# Patient Record
Sex: Male | Born: 1955
Health system: Southern US, Community
[De-identification: ages and names within clinical notes are randomized; demographics above are authoritative.]

## PROBLEM LIST (undated history)

## (undated) DIAGNOSIS — D099 Carcinoma in situ, unspecified: Secondary | ICD-10-CM

## (undated) DIAGNOSIS — D229 Melanocytic nevi, unspecified: Secondary | ICD-10-CM

## (undated) DIAGNOSIS — E785 Hyperlipidemia, unspecified: Secondary | ICD-10-CM

## (undated) DIAGNOSIS — C6290 Malignant neoplasm of unspecified testis, unspecified whether descended or undescended: Secondary | ICD-10-CM

## (undated) HISTORY — DX: Hyperlipidemia, unspecified: E78.5

## (undated) HISTORY — DX: Melanocytic nevi, unspecified: D22.9

## (undated) HISTORY — DX: Malignant neoplasm of unspecified testis, unspecified whether descended or undescended: C62.90

## (undated) HISTORY — PX: KNEE SURGERY: SHX244

## (undated) HISTORY — PX: POLYPECTOMY: SHX149

## (undated) HISTORY — PX: ROTATOR CUFF REPAIR: SHX139

## (undated) HISTORY — PX: COLONOSCOPY: SHX174

## (undated) HISTORY — DX: Carcinoma in situ, unspecified: D09.9

---

## 2000-02-01 ENCOUNTER — Emergency Department (HOSPITAL_COMMUNITY): Admission: EM | Admit: 2000-02-01 | Discharge: 2000-02-01 | Payer: Self-pay | Admitting: Emergency Medicine

## 2000-03-24 ENCOUNTER — Emergency Department (HOSPITAL_COMMUNITY): Admission: EM | Admit: 2000-03-24 | Discharge: 2000-03-24 | Payer: Self-pay | Admitting: Emergency Medicine

## 2000-05-03 ENCOUNTER — Encounter: Payer: Self-pay | Admitting: Internal Medicine

## 2004-07-04 HISTORY — PX: OTHER SURGICAL HISTORY: SHX169

## 2004-07-19 ENCOUNTER — Encounter: Admission: RE | Admit: 2004-07-19 | Discharge: 2004-07-19 | Payer: Self-pay | Admitting: Internal Medicine

## 2004-07-19 ENCOUNTER — Ambulatory Visit: Payer: Self-pay | Admitting: Internal Medicine

## 2004-07-20 ENCOUNTER — Ambulatory Visit: Payer: Self-pay | Admitting: Internal Medicine

## 2004-07-20 ENCOUNTER — Observation Stay (HOSPITAL_COMMUNITY): Admission: EM | Admit: 2004-07-20 | Discharge: 2004-07-21 | Payer: Self-pay | Admitting: Neurosurgery

## 2004-07-21 ENCOUNTER — Encounter (INDEPENDENT_AMBULATORY_CARE_PROVIDER_SITE_OTHER): Payer: Self-pay | Admitting: Specialist

## 2004-07-23 ENCOUNTER — Ambulatory Visit: Payer: Self-pay | Admitting: Internal Medicine

## 2004-07-23 ENCOUNTER — Encounter: Admission: RE | Admit: 2004-07-23 | Discharge: 2004-07-23 | Payer: Self-pay | Admitting: Internal Medicine

## 2004-07-26 ENCOUNTER — Ambulatory Visit: Payer: Self-pay | Admitting: Oncology

## 2004-07-27 ENCOUNTER — Ambulatory Visit: Admission: RE | Admit: 2004-07-27 | Discharge: 2004-07-27 | Payer: Self-pay | Admitting: Urology

## 2004-07-27 ENCOUNTER — Ambulatory Visit (HOSPITAL_BASED_OUTPATIENT_CLINIC_OR_DEPARTMENT_OTHER): Admission: RE | Admit: 2004-07-27 | Discharge: 2004-07-27 | Payer: Self-pay | Admitting: Urology

## 2004-07-27 ENCOUNTER — Encounter (INDEPENDENT_AMBULATORY_CARE_PROVIDER_SITE_OTHER): Payer: Self-pay | Admitting: *Deleted

## 2004-08-03 ENCOUNTER — Ambulatory Visit (HOSPITAL_COMMUNITY): Admission: RE | Admit: 2004-08-03 | Discharge: 2004-08-03 | Payer: Self-pay | Admitting: Oncology

## 2004-08-03 ENCOUNTER — Ambulatory Visit: Admission: RE | Admit: 2004-08-03 | Discharge: 2004-09-14 | Payer: Self-pay | Admitting: Radiation Oncology

## 2004-09-22 ENCOUNTER — Ambulatory Visit: Payer: Self-pay | Admitting: Oncology

## 2004-10-01 ENCOUNTER — Encounter: Payer: Self-pay | Admitting: Internal Medicine

## 2004-10-21 ENCOUNTER — Ambulatory Visit: Admission: RE | Admit: 2004-10-21 | Discharge: 2004-10-21 | Payer: Self-pay | Admitting: Radiation Oncology

## 2004-10-27 ENCOUNTER — Ambulatory Visit (HOSPITAL_COMMUNITY): Admission: RE | Admit: 2004-10-27 | Discharge: 2004-10-27 | Payer: Self-pay | Admitting: Oncology

## 2005-01-20 ENCOUNTER — Ambulatory Visit: Payer: Self-pay | Admitting: Oncology

## 2005-04-12 ENCOUNTER — Ambulatory Visit (HOSPITAL_COMMUNITY): Admission: RE | Admit: 2005-04-12 | Discharge: 2005-04-12 | Payer: Self-pay | Admitting: Oncology

## 2005-05-12 ENCOUNTER — Ambulatory Visit: Payer: Self-pay | Admitting: Oncology

## 2005-11-11 ENCOUNTER — Ambulatory Visit: Payer: Self-pay | Admitting: Oncology

## 2005-11-11 LAB — CBC WITH DIFFERENTIAL/PLATELET
BASO%: 0 % (ref 0.0–2.0)
EOS%: 1.4 % (ref 0.0–7.0)
HCT: 40.2 % (ref 38.7–49.9)
LYMPH%: 17.4 % (ref 14.0–48.0)
MCH: 31.9 pg (ref 28.0–33.4)
MCHC: 35 g/dL (ref 32.0–35.9)
MCV: 91.3 fL (ref 81.6–98.0)
MONO%: 9.7 % (ref 0.0–13.0)
NEUT%: 71.5 % (ref 40.0–75.0)
Platelets: 246 10*3/uL (ref 145–400)

## 2005-11-11 LAB — COMPREHENSIVE METABOLIC PANEL
ALT: 17 U/L (ref 0–40)
CO2: 24 mEq/L (ref 19–32)
Calcium: 9.3 mg/dL (ref 8.4–10.5)
Chloride: 106 mEq/L (ref 96–112)
Creatinine, Ser: 1 mg/dL (ref 0.4–1.5)
Sodium: 138 mEq/L (ref 135–145)
Total Protein: 7 g/dL (ref 6.0–8.3)

## 2005-11-11 LAB — LACTATE DEHYDROGENASE: LDH: 155 U/L (ref 94–250)

## 2005-11-12 IMAGING — PT NM PET TUM IMG SKULL BASE T - THIGH
1 of 4 series · 1 of 25 positions shown · non-contrast
Comparison: This study is correlated with recent CT scan and ultrasound.

CLINICAL DATA: Testicular cancer.  

 FDG PET-CT TUMOR IMAGING (SKULL BASE TO THIGHS)
 Patient Weight:  170 pounds.  
 Fasting Blood Glucose:  87
TECHNIQUE: 15.4 mCi F-18 FDG were administered via right antecubital.  Full ring PET imaging was performed from the skull base through the mid-thighs 70 minutes after injection.  CT data was obtained and used for attenuation correction and anatomic localization only.  (This was not acquired as a diagnostic CT examination.)

[Series 2: ct images · axial · 3.8mm · 0.98mm/px · 1 of 267 slices shown]
[im 267/267  brain]
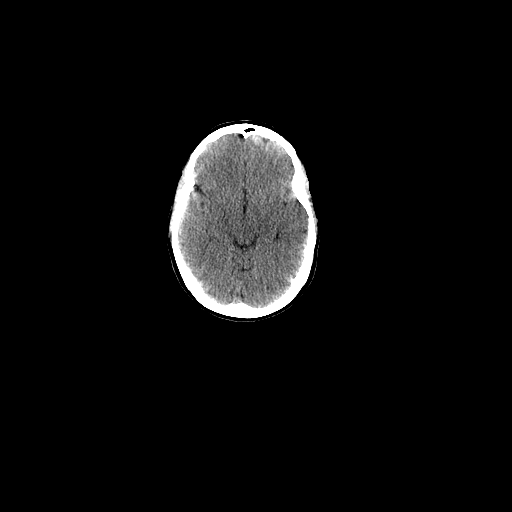

[1 of 25 positions shown; findings below may reference images not displayed]

FINDINGS: The patient has had a left orchiectomy and a biopsy of a left retroperitoneal nodal mass.
 There is abnormal FDG accumulation in the left retroperitoneal mass, which is just below the level of the left renal vein.  This has an SUV of 5.3.  There is mild obstructive hydronephrosis of the left kidney.  I do not see any other definite areas of abnormal FDG accumulation in the pelvis.  There is FDG activity in the surgical bed in the left groin area.  
 No areas of abnormal FDG accumulation are seen in the neck or chest.
IMPRESSION: 1.  Abnormal FDG accumulation in the bulky left retroperitoneal nodal mass located just below the left renal vein.  There is obstructive hydronephrosis of the left kidney.  
 2.  No other areas of abnormal FDG accumulation are seen to suggest other areas of metastatic disease.  
 3.  FDG accumulation in the region of the surgical incision in the left groin area.

## 2005-11-15 ENCOUNTER — Ambulatory Visit (HOSPITAL_COMMUNITY): Admission: RE | Admit: 2005-11-15 | Discharge: 2005-11-15 | Payer: Self-pay | Admitting: Oncology

## 2006-05-09 ENCOUNTER — Ambulatory Visit (HOSPITAL_COMMUNITY): Admission: RE | Admit: 2006-05-09 | Discharge: 2006-05-09 | Payer: Self-pay | Admitting: Oncology

## 2006-05-10 ENCOUNTER — Ambulatory Visit: Payer: Self-pay | Admitting: Oncology

## 2006-05-12 LAB — COMPREHENSIVE METABOLIC PANEL
ALT: 15 U/L (ref 0–53)
AST: 17 U/L (ref 0–37)
Alkaline Phosphatase: 77 U/L (ref 39–117)
CO2: 26 mEq/L (ref 19–32)
Sodium: 140 mEq/L (ref 135–145)
Total Bilirubin: 0.4 mg/dL (ref 0.3–1.2)
Total Protein: 7.4 g/dL (ref 6.0–8.3)

## 2006-05-12 LAB — CBC WITH DIFFERENTIAL/PLATELET
BASO%: 0.3 % (ref 0.0–2.0)
EOS%: 2.4 % (ref 0.0–7.0)
LYMPH%: 21.1 % (ref 14.0–48.0)
MCHC: 35 g/dL (ref 32.0–35.9)
MONO#: 0.5 10*3/uL (ref 0.1–0.9)
Platelets: 240 10*3/uL (ref 145–400)
RBC: 4.63 10*6/uL (ref 4.20–5.71)
WBC: 5.6 10*3/uL (ref 4.0–10.0)
lymph#: 1.2 10*3/uL (ref 0.9–3.3)

## 2006-05-12 LAB — LACTATE DEHYDROGENASE: LDH: 154 U/L (ref 94–250)

## 2006-12-19 ENCOUNTER — Ambulatory Visit: Payer: Self-pay | Admitting: Internal Medicine

## 2006-12-19 LAB — CONVERTED CEMR LAB
AST: 24 units/L (ref 0–37)
Alkaline Phosphatase: 67 units/L (ref 39–117)
BUN: 17 mg/dL (ref 6–23)
Basophils Relative: 0.2 % (ref 0.0–1.0)
Creatinine, Ser: 1 mg/dL (ref 0.4–1.5)
GFR calc non Af Amer: 84 mL/min
Glucose, Bld: 82 mg/dL (ref 70–99)
HDL: 43.8 mg/dL (ref >39.0)
Hemoglobin: 13.5 g/dL (ref 13.0–17.0)
MCV: 91.1 fL (ref 78.0–100.0)
Monocytes Absolute: 0.4 10*3/uL (ref 0.2–0.7)
Neutrophils Relative %: 69.4 % (ref 43.0–77.0)
PSA: 0.42 ng/mL (ref 0.10–4.00)
Platelets: 202 10*3/uL (ref 150–400)
RBC: 4.23 M/uL (ref 4.22–5.81)
RDW: 13 % (ref 11.5–14.6)
TSH: 1 microintl units/mL (ref 0.35–5.50)
Triglycerides: 68 mg/dL (ref 0–149)
VLDL: 14 mg/dL (ref 0–40)

## 2006-12-26 ENCOUNTER — Ambulatory Visit: Payer: Self-pay | Admitting: Internal Medicine

## 2007-01-26 ENCOUNTER — Ambulatory Visit: Payer: Self-pay | Admitting: Gastroenterology

## 2007-02-13 ENCOUNTER — Ambulatory Visit: Payer: Self-pay | Admitting: Gastroenterology

## 2007-02-13 ENCOUNTER — Encounter: Payer: Self-pay | Admitting: Gastroenterology

## 2007-02-13 LAB — HM COLONOSCOPY: HM Colonoscopy: NORMAL

## 2007-05-04 ENCOUNTER — Ambulatory Visit: Payer: Self-pay | Admitting: Oncology

## 2007-05-08 ENCOUNTER — Encounter: Payer: Self-pay | Admitting: Internal Medicine

## 2007-05-08 ENCOUNTER — Ambulatory Visit (HOSPITAL_COMMUNITY): Admission: RE | Admit: 2007-05-08 | Discharge: 2007-05-08 | Payer: Self-pay | Admitting: Oncology

## 2007-05-08 LAB — COMPREHENSIVE METABOLIC PANEL
ALT: 14 U/L (ref 0–53)
AST: 17 U/L (ref 0–37)
Albumin: 4.2 g/dL (ref 3.5–5.2)
Calcium: 9.3 mg/dL (ref 8.4–10.5)
Chloride: 102 mEq/L (ref 96–112)
Potassium: 4.4 mEq/L (ref 3.5–5.3)

## 2007-05-08 LAB — CBC WITH DIFFERENTIAL/PLATELET
BASO%: 0.2 % (ref 0.0–2.0)
EOS%: 1.8 % (ref 0.0–7.0)
MCH: 32.6 pg (ref 28.0–33.4)
MCHC: 36.5 g/dL — ABNORMAL HIGH (ref 32.0–35.9)
RBC: 4.32 10*6/uL (ref 4.20–5.71)
RDW: 13.4 % (ref 11.2–14.6)
lymph#: 1 10*3/uL (ref 0.9–3.3)

## 2007-05-15 ENCOUNTER — Encounter: Payer: Self-pay | Admitting: Internal Medicine

## 2007-07-24 ENCOUNTER — Ambulatory Visit: Payer: Self-pay | Admitting: Internal Medicine

## 2007-07-24 LAB — CONVERTED CEMR LAB
HDL: 39.2 mg/dL (ref >39.0)
LDL Cholesterol: 146 mg/dL — ABNORMAL HIGH (ref 0–99)
Total CHOL/HDL Ratio: 5.1

## 2007-07-31 ENCOUNTER — Ambulatory Visit: Payer: Self-pay | Admitting: Internal Medicine

## 2007-07-31 DIAGNOSIS — C6290 Malignant neoplasm of unspecified testis, unspecified whether descended or undescended: Secondary | ICD-10-CM | POA: Insufficient documentation

## 2007-07-31 DIAGNOSIS — E785 Hyperlipidemia, unspecified: Secondary | ICD-10-CM | POA: Insufficient documentation

## 2007-07-31 DIAGNOSIS — R635 Abnormal weight gain: Secondary | ICD-10-CM | POA: Insufficient documentation

## 2007-07-31 LAB — CONVERTED CEMR LAB
Cholesterol, target level: 200 mg/dL
LDL Goal: 130 mg/dL

## 2007-08-03 ENCOUNTER — Telehealth: Payer: Self-pay | Admitting: Internal Medicine

## 2007-10-16 ENCOUNTER — Ambulatory Visit: Payer: Self-pay | Admitting: Internal Medicine

## 2007-10-16 DIAGNOSIS — T887XXA Unspecified adverse effect of drug or medicament, initial encounter: Secondary | ICD-10-CM | POA: Insufficient documentation

## 2007-10-16 DIAGNOSIS — E039 Hypothyroidism, unspecified: Secondary | ICD-10-CM | POA: Insufficient documentation

## 2007-10-16 LAB — CONVERTED CEMR LAB
ALT: 21 units/L (ref 0–53)
Albumin: 4 g/dL (ref 3.5–5.2)
Alkaline Phosphatase: 70 units/L (ref 39–117)
Bilirubin, Direct: 0.2 mg/dL (ref 0.0–0.3)
Cholesterol: 191 mg/dL (ref 0–200)
HDL: 39.8 mg/dL (ref >39.0)
LDL Cholesterol: 140 mg/dL — ABNORMAL HIGH (ref 0–99)
Testosterone: 471.78 ng/dL (ref 350.00–890)
Total Bilirubin: 0.8 mg/dL (ref 0.3–1.2)
Total Protein: 6.8 g/dL (ref 6.0–8.3)
Triglycerides: 55 mg/dL (ref 0–149)
VLDL: 11 mg/dL (ref 0–40)

## 2007-10-23 ENCOUNTER — Ambulatory Visit: Payer: Self-pay | Admitting: Internal Medicine

## 2007-10-23 DIAGNOSIS — B070 Plantar wart: Secondary | ICD-10-CM | POA: Insufficient documentation

## 2007-10-23 DIAGNOSIS — E291 Testicular hypofunction: Secondary | ICD-10-CM | POA: Insufficient documentation

## 2008-01-15 ENCOUNTER — Ambulatory Visit: Payer: Self-pay | Admitting: Internal Medicine

## 2008-01-15 LAB — CONVERTED CEMR LAB: Total Protein: 6.6 g/dL (ref 6.0–8.3)

## 2008-01-22 ENCOUNTER — Ambulatory Visit: Payer: Self-pay | Admitting: Internal Medicine

## 2008-05-01 ENCOUNTER — Ambulatory Visit: Payer: Self-pay | Admitting: Oncology

## 2008-05-06 ENCOUNTER — Encounter: Payer: Self-pay | Admitting: Internal Medicine

## 2008-05-06 ENCOUNTER — Ambulatory Visit (HOSPITAL_COMMUNITY): Admission: RE | Admit: 2008-05-06 | Discharge: 2008-05-06 | Payer: Self-pay | Admitting: Oncology

## 2008-05-06 LAB — CBC WITH DIFFERENTIAL/PLATELET
BASO%: 0.3 % (ref 0.0–2.0)
EOS%: 1.7 % (ref 0.0–7.0)
HGB: 14.8 g/dL (ref 13.0–17.1)
MONO%: 8.8 % (ref 0.0–13.0)
NEUT#: 3.5 10*3/uL (ref 1.5–6.5)
NEUT%: 66.3 % (ref 40.0–75.0)
WBC: 5.3 10*3/uL (ref 4.0–10.0)

## 2008-05-06 LAB — COMPREHENSIVE METABOLIC PANEL
Albumin: 4.4 g/dL (ref 3.5–5.2)
CO2: 28 mEq/L (ref 19–32)
Glucose, Bld: 86 mg/dL (ref 70–99)
Potassium: 4.6 mEq/L (ref 3.5–5.3)
Sodium: 140 mEq/L (ref 135–145)
Total Protein: 7 g/dL (ref 6.0–8.3)

## 2008-05-06 LAB — LACTATE DEHYDROGENASE: LDH: 134 U/L (ref 94–250)

## 2008-05-13 ENCOUNTER — Encounter: Payer: Self-pay | Admitting: Internal Medicine

## 2008-06-18 ENCOUNTER — Telehealth: Payer: Self-pay | Admitting: Internal Medicine

## 2008-06-30 ENCOUNTER — Telehealth: Payer: Self-pay | Admitting: Internal Medicine

## 2008-09-17 ENCOUNTER — Ambulatory Visit: Payer: Self-pay | Admitting: Internal Medicine

## 2008-09-17 LAB — CONVERTED CEMR LAB
Albumin: 3.8 g/dL (ref 3.5–5.2)
Alkaline Phosphatase: 73 units/L (ref 39–117)
Basophils Relative: 0.1 % (ref 0.0–3.0)
Bilirubin, Direct: 0 mg/dL (ref 0.0–0.3)
Calcium: 9.2 mg/dL (ref 8.4–10.5)
Chloride: 107 meq/L (ref 96–112)
Cholesterol: 205 mg/dL — ABNORMAL HIGH (ref 0–200)
Creatinine, Ser: 0.9 mg/dL (ref 0.4–1.5)
Eosinophils Absolute: 0.1 10*3/uL (ref 0.0–0.7)
Eosinophils Relative: 1.6 % (ref 0.0–5.0)
GFR calc non Af Amer: 93.81 mL/min (ref >60)
Glucose, Bld: 89 mg/dL (ref 70–99)
Hemoglobin: 14.5 g/dL (ref 13.0–17.0)
Lymphocytes Relative: 23 % (ref 12.0–46.0)
MCHC: 34.7 g/dL (ref 30.0–36.0)
Monocytes Absolute: 0.3 10*3/uL (ref 0.1–1.0)
Monocytes Relative: 6.9 % (ref 3.0–12.0)
Neutrophils Relative %: 68.4 % (ref 43.0–77.0)
RBC: 4.52 M/uL (ref 4.22–5.81)
RDW: 12.7 % (ref 11.5–14.6)
Sodium: 142 meq/L (ref 135–145)
Total CHOL/HDL Ratio: 5
Total Protein: 7 g/dL (ref 6.0–8.3)
Triglycerides: 73 mg/dL (ref 0.0–149.0)
pH: 7.5

## 2008-09-24 ENCOUNTER — Ambulatory Visit: Payer: Self-pay | Admitting: Internal Medicine

## 2008-09-24 LAB — CONVERTED CEMR LAB: LDL Goal: 160 mg/dL

## 2008-10-01 ENCOUNTER — Telehealth: Payer: Self-pay | Admitting: Internal Medicine

## 2008-12-25 ENCOUNTER — Ambulatory Visit: Payer: Self-pay | Admitting: Internal Medicine

## 2008-12-25 LAB — CONVERTED CEMR LAB
ALT: 19 units/L (ref 0–53)
HDL: 45.8 mg/dL (ref >39.00)
Triglycerides: 58 mg/dL (ref 0.0–149.0)

## 2009-01-01 ENCOUNTER — Ambulatory Visit: Payer: Self-pay | Admitting: Internal Medicine

## 2009-05-05 ENCOUNTER — Ambulatory Visit: Payer: Self-pay | Admitting: Oncology

## 2009-05-07 ENCOUNTER — Encounter (INDEPENDENT_AMBULATORY_CARE_PROVIDER_SITE_OTHER): Payer: Self-pay | Admitting: *Deleted

## 2009-05-07 LAB — COMPREHENSIVE METABOLIC PANEL
AST: 18 U/L (ref 0–37)
Albumin: 4.5 g/dL (ref 3.5–5.2)
CO2: 22 mEq/L (ref 19–32)
Chloride: 105 mEq/L (ref 96–112)
Creatinine, Ser: 1.03 mg/dL (ref 0.40–1.50)
Glucose, Bld: 90 mg/dL (ref 70–99)
Sodium: 139 mEq/L (ref 135–145)
Total Protein: 7.4 g/dL (ref 6.0–8.3)

## 2009-05-07 LAB — CBC WITH DIFFERENTIAL/PLATELET
BASO%: 0.4 % (ref 0.0–2.0)
Basophils Absolute: 0 10*3/uL (ref 0.0–0.1)
EOS%: 2.5 % (ref 0.0–7.0)
Eosinophils Absolute: 0.1 10*3/uL (ref 0.0–0.5)
MCHC: 34.8 g/dL (ref 32.0–36.0)
MONO#: 0.5 10*3/uL (ref 0.1–0.9)
MONO%: 9.1 % (ref 0.0–14.0)
NEUT#: 3.2 10*3/uL (ref 1.5–6.5)
NEUT%: 62.8 % (ref 39.0–75.0)
RBC: 4.65 10*6/uL (ref 4.20–5.82)
lymph#: 1.3 10*3/uL (ref 0.9–3.3)

## 2009-05-12 ENCOUNTER — Encounter (INDEPENDENT_AMBULATORY_CARE_PROVIDER_SITE_OTHER): Payer: Self-pay | Admitting: *Deleted

## 2009-09-24 ENCOUNTER — Ambulatory Visit: Payer: Self-pay | Admitting: Internal Medicine

## 2009-09-24 LAB — CONVERTED CEMR LAB
AST: 24 units/L (ref 0–37)
Alkaline Phosphatase: 65 units/L (ref 39–117)
BUN: 16 mg/dL (ref 6–23)
Blood in Urine, dipstick: NEGATIVE
Calcium: 9.1 mg/dL (ref 8.4–10.5)
Chloride: 106 meq/L (ref 96–112)
Cholesterol: 209 mg/dL — ABNORMAL HIGH (ref 0–200)
Creatinine, Ser: 0.9 mg/dL (ref 0.4–1.5)
Glucose, Urine, Semiquant: NEGATIVE
HDL: 49.4 mg/dL (ref >39.00)
Hemoglobin: 14 g/dL (ref 13.0–17.0)
Lymphocytes Relative: 26.7 % (ref 12.0–46.0)
Lymphs Abs: 1.1 10*3/uL (ref 0.7–4.0)
MCV: 93.7 fL (ref 78.0–100.0)
Monocytes Absolute: 0.4 10*3/uL (ref 0.1–1.0)
Monocytes Relative: 10.1 % (ref 3.0–12.0)
Neutro Abs: 2.7 10*3/uL (ref 1.4–7.7)
PSA: 0.59 ng/mL (ref 0.10–4.00)
Potassium: 4 meq/L (ref 3.5–5.1)
Protein, U semiquant: NEGATIVE
RBC: 4.4 M/uL (ref 4.22–5.81)
RDW: 13.3 % (ref 11.5–14.6)
Specific Gravity, Urine: 1.025
Testosterone: 603.6 ng/dL (ref 350.00–890.00)
Total Bilirubin: 0.5 mg/dL (ref 0.3–1.2)
Total CHOL/HDL Ratio: 4
Total Protein: 7 g/dL (ref 6.0–8.3)
Triglycerides: 78 mg/dL (ref 0.0–149.0)
Urobilinogen, UA: 0.2
WBC Urine, dipstick: NEGATIVE
WBC: 4.3 10*3/uL — ABNORMAL LOW (ref 4.5–10.5)

## 2009-10-12 ENCOUNTER — Ambulatory Visit: Payer: Self-pay | Admitting: Internal Medicine

## 2009-12-16 ENCOUNTER — Telehealth: Payer: Self-pay | Admitting: Internal Medicine

## 2010-01-13 ENCOUNTER — Telehealth: Payer: Self-pay | Admitting: *Deleted

## 2010-04-08 ENCOUNTER — Ambulatory Visit: Payer: Self-pay | Admitting: Internal Medicine

## 2010-04-08 LAB — CONVERTED CEMR LAB
Bilirubin, Direct: 0.1 mg/dL (ref 0.0–0.3)
Total Protein: 6.5 g/dL (ref 6.0–8.3)

## 2010-04-15 ENCOUNTER — Ambulatory Visit: Payer: Self-pay | Admitting: Internal Medicine

## 2010-04-15 DIAGNOSIS — M25429 Effusion, unspecified elbow: Secondary | ICD-10-CM | POA: Insufficient documentation

## 2010-07-24 ENCOUNTER — Encounter: Payer: Self-pay | Admitting: Internal Medicine

## 2010-07-25 ENCOUNTER — Encounter: Payer: Self-pay | Admitting: Oncology

## 2010-08-03 NOTE — Assessment & Plan Note (Signed)
Summary: cpx/njr/PT RESCD FROM BUMP//CCM   Vital Signs:  Patient profile:   55 year old male Height:      69 inches Weight:      176 pounds BMI:     26.08 Temp:     98.2 degrees F oral Pulse rate:   64 / minute Pulse rhythm:   regular Resp:     14 per minute BP sitting:   116 / 78  (left arm)  Vitals Entered By: Willy Eddy, LPN (October 12, 2009 8:28 AM)  Nutrition Counseling: Patient's BMI is greater than 25 and therefore counseled on weight management options.  CC: cpx, Lipid Management   CC:  cpx and Lipid Management.  History of Present Illness: The pt was asked about all immunizations, health maint. services that are appropriate to their age and was given guidance on diet exercize  and weight management   lipid mangement  Hyperlipidemia Follow-Up      This is a 55 year old man who presents for Hyperlipidemia follow-up.  The patient denies muscle aches, GI upset, abdominal pain, flushing, itching, constipation, diarrhea, and fatigue.  The patient denies the following symptoms: chest pain/pressure, exercise intolerance, dypsnea, palpitations, syncope, and pedal edema.  Compliance with medications (by patient report) has been near 100%.  Dietary compliance has been good.  The patient reports exercising 3-4X per week.  Adjunctive measures currently used by the patient include niacin and weight reduction.    Hyperlipidemia Follow-Up      had side effects for  statins.  The patient denies muscle aches, GI upset, abdominal pain, flushing, itching, constipation, diarrhea, and fatigue.  The patient denies the following symptoms: chest pain/pressure, exercise intolerance, dypsnea, palpitations, syncope, and pedal edema.    Lipid Management History:      Positive NCEP/ATP III risk factors include male age 55 years old or older.  Negative NCEP/ATP III risk factors include no family history for ischemic heart disease and non-tobacco-user status.        The patient states that he  knows about the "Therapeutic Lifestyle Change" diet.  The patient expresses understanding of adjunctive measures for cholesterol lowering.  Adjunctive measures started by the patient include aerobic exercise and weight reduction.     Preventive Screening-Counseling & Management  Alcohol-Tobacco     Smoking Status: never     Passive Smoke Exposure: no  Problems Prior to Update: 1)  Uns Advrs Eff Uns Rx Medicinal&biological Sbstnc  (ICD-995.20) 2)  Preventive Health Care  (ICD-V70.0) 3)  Plantar Wart  (ICD-078.12) 4)  Hypogonadism, Male  (ICD-257.2) 5)  Hypothyroidism  (ICD-244.9) 6)  Uns Advrs Eff Uns Rx Medicinal&biological Sbstnc  (ICD-995.20) 7)  Abnormal Weight Gain  (ICD-783.1) 8)  Testicular Cancer  (ICD-186.9) 9)  Hyperlipidemia  (ICD-272.4)  Current Problems (verified): 1)  Uns Advrs Eff Uns Rx Medicinal&biological Sbstnc  (ICD-995.20) 2)  Preventive Health Care  (ICD-V70.0) 3)  Plantar Wart  (ICD-078.12) 4)  Hypogonadism, Male  (ICD-257.2) 5)  Hypothyroidism  (ICD-244.9) 6)  Uns Advrs Eff Uns Rx Medicinal&biological Sbstnc  (ICD-995.20) 7)  Abnormal Weight Gain  (ICD-783.1) 8)  Testicular Cancer  (ICD-186.9) 9)  Hyperlipidemia  (ICD-272.4)  Medications Prior to Update: 1)  Androgel Pump 1 %  Gel (Testosterone) .... 4 Pumps To Skin As Directed Daily 2)  Antioxidant   Tabs (Multiple Vitamins-Minerals) .... At Times 3)  Adult Aspirin Low Strength 81 Mg  Tbdp (Aspirin) .Marland Kitchen.. 1 Once Daily 4)  Fish Oil 1000 Mg Caps (Omega-3  Fatty Acids) .... Two Tabs Bid  Current Medications (verified): 1)  Co Q-10 Plus Red Yeast Rice 60-600 Mg Caps (Coenzyme Q10-Red Yeast Rice) .... Combination  ( Beyond Red Rice Yeast) 2)  Androgel Pump 1 %  Gel (Testosterone) .... 4 Pumps To Skin As Directed Daily 3)  Antioxidant   Tabs (Multiple Vitamins-Minerals) .... At Times 4)  Adult Aspirin Low Strength 81 Mg  Tbdp (Aspirin) .Marland Kitchen.. 1 Once Daily 5)  Fish Oil 1000 Mg Caps (Omega-3 Fatty Acids) .... Two  Tabs Bid 6)  Red Yeast Rice 600 Mg Tabs (Red Yeast Rice Extract) .Marland Kitchen.. 1 Once Daily  Allergies (verified): 1)  ! * Bee Sting  Past History:  Family History: Last updated: August 23, 2007 Father died 12/06/00 epilepsy mother alive and well no family hx of cancer  Social History: Last updated: Aug 23, 2007 Occupation:construction Married Never Smoked Alcohol use-no Drug use-no Regular exercise-yes  Risk Factors: Exercise: yes (08/23/2007)  Risk Factors: Smoking Status: never (10/12/2009) Passive Smoke Exposure: no (10/12/2009)  Past medical, surgical, family and social histories (including risk factors) reviewed, and no changes noted (except as noted below).  Past Medical History: Reviewed history from August 23, 2007 and no changes required. Hyperlipidmia testicular cancer Left testicular orchiectomy 12-06-2004 Hyperlipidemia  Past Surgical History: Reviewed history from 2007-08-23 and no changes required. Orchiectomy L  Family History: Reviewed history from 08/23/07 and no changes required. Father died Dec 06, 2000 epilepsy mother alive and well no family hx of cancer  Social History: Reviewed history from 08-23-07 and no changes required. 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    Physical Exam  General:  Well-developed,well-nourished,in no acute distress; alert,appropriate and cooperative throughout  examination Head:  Normocephalic and atraumatic without obvious abnormalities. No apparent alopecia or balding. Eyes:  pupils equal and pupils round.   Ears:  R ear normal and L ear normal.   Nose:  no external deformity and no nasal discharge.   Mouth:  good dentition and pharynx pink and moist.   Neck:  No deformities, masses, or tenderness noted. Lungs:  Normal respiratory effort, chest expands symmetrically. Lungs are clear to auscultation, no crackles or wheezes. Heart:  Normal rate and regular rhythm. S1 and S2 normal without gallop, murmur, click, rub or other extra sounds. Abdomen:  Bowel sounds positive,abdomen soft and non-tender without masses, organomegaly or hernias noted. Msk:  No deformity or scoliosis noted of thoracic or lumbar spine.   Pulses:  R and L carotid,radial,femoral,dorsalis pedis and posterior tibial pulses are full and equal bilaterally Extremities:  No clubbing, cyanosis, edema, or deformity noted with normal full range of motion of all joints.   Neurologic:  No cranial nerve deficits noted. Station and gait are normal. Plantar reflexes are down-going bilaterally. DTRs are symmetrical throughout. Sensory, motor and coordinative functions appear intact.   Impression & Recommendations:  Problem # 1:  PREVENTIVE HEALTH CARE (ICD-V70.0) The pt was asked about all immunizations, health maint. services that are appropriate to their age and was given guidance on diet exercize  and weight management  Colonoscopy: normal (01/26/2007) Td Booster: Tdap (07/04/2006)   Chol: 209 (09/24/2009)  HDL: 49.40 (09/24/2009)   LDL: 133 (12/25/2008)   TG: 78.0 (09/24/2009) TSH: 1.44 (09/24/2009)   PSA: 0.59 (09/24/2009) Next Colonoscopy due:: 02/2017 (09/24/2008)  Discussed using sunscreen, use of alcohol, drug use, self testicular exam, routine dental care, routine eye care, routine physical exam, seat belts, multiple vitamins, osteoporosis prevention, adequate calcium intake in  diet, and recommendations for immunizations.  Discussed exercise and checking cholesterol.  Discussed gun safety, safe sex, and contraception. Also recommend checking PSA.  Problem # 2:  HYPERLIPIDEMIA (ICD-272.4) had side effects from statin trial of red rice yeast   Labs Reviewed: SGOT: 24 (09/24/2009)   SGPT: 19 (09/24/2009)  Lipid Goals: Chol Goal: 200 (07/31/2007)   HDL Goal: 40 (07/31/2007)   LDL Goal: 160 (09/24/2008)   TG Goal: 150 (07/31/2007)  Prior 10 Yr Risk Heart Disease: 6 % (01/01/2009)   HDL:49.40 (09/24/2009), 45.80 (12/25/2008)  LDL:133 (12/25/2008), 117 (01/15/2008)  Chol:209 (09/24/2009), 190 (12/25/2008)  Trig:78.0 (09/24/2009), 58.0 (12/25/2008)  Complete Medication List: 1)  Co Q-10 Plus Red Yeast Rice 60-600 Mg Caps (Coenzyme q10-red yeast rice) .... Combination  ( beyond red rice yeast) 2)  Androgel Pump 1 % Gel (Testosterone) .... 4 pumps to skin as directed daily 3)  Antioxidant Tabs (Multiple vitamins-minerals) .... At times 4)  Adult Aspirin Low Strength 81 Mg Tbdp (Aspirin) .Marland Kitchen.. 1 once daily 5)  Fish Oil 1000 Mg Caps (Omega-3 fatty acids) .... Two tabs bid 6)  Red Yeast Rice 600 Mg Tabs (Red yeast rice extract) .Marland Kitchen.. 1 once daily  Lipid Assessment/Plan:      Based on NCEP/ATP III, the patient's risk factor category is "0-1 risk factors".  The patient's lipid goals are as follows: Total cholesterol goal is 200; LDL cholesterol goal is 160; HDL cholesterol goal is 40; Triglyceride goal is 150.  His LDL cholesterol goal has not been met.  Secondary causes for hyperlipidemia have been ruled out.  He has been counseled on adjunctive measures for lowering his cholesterol and has been provided with dietary instructions.    Patient Instructions: 1)  Must  faithful with the red rice yeast 2)  weight reduction 5 lbs 3)  Please schedule a follow-up appointment in 6 months. 4)  Hepatic Panel prior to visit, ICD-9:995.20 5)  Lipid Panel prior to visit,  ICD-9:272.4

## 2010-08-03 NOTE — Progress Notes (Signed)
Summary: REQ FOR RETURN CALL  Phone Note Call from Patient   Caller: Patient   [phone] Reason for Call: Talk to Nurse Complaint: Urinary/GYN Problems Summary of Call: Pt called to speak with Dr Bynum Bellows, LPN...Marland KitchenMarland KitchenPt req an alternate medication because [employer] will no longer cover the cost of this medication  ANDROGEL PUMP 1 %  GEL .... Pt req a return call so he can discuss alternate medication.... Pt can be reached at [phone].  Initial call taken by: Debbra Riding,  December 16, 2009 3:11 PM  Follow-up for Phone Call        [facility] and spoke with Viviann Spare (Representative).... He adv that the pts acct is showing that the medication is still covered.  Contacted patient and requested that he fax a copy of the letter he received to [facility] so we can call and follow up on it.  Follow-up by: Debbra Riding,  December 16, 2009 3:47 PM  Additional Follow-up for Phone Call Additional follow up Details #1::        Awaiting faxed letter from pt... Pt was advised that we would attempt to handle situation as it arises concerning insurance [facility]) refusal to cover his medication (Androgel) after July 1st, 2011.   Additional Follow-up by: Debbra Riding,  December 17, 2009 8:47 AM

## 2010-08-03 NOTE — Progress Notes (Signed)
----   Converted from flag ---- ---- 12/17/2009 1:51 PM, Willy Eddy, LPN wrote: call [facility] and call in Delvonte Kirkpatrick's androgel to get it pre approved ------------------------------  changed to testim per insurance --pt informed and med called to [facility]

## 2010-08-03 NOTE — Assessment & Plan Note (Signed)
Summary: 6 mo rov/mm/pt rescd from bump//ccm   Vital Signs:  Patient profile:   55 year old male Height:      69 inches Weight:      168 pounds BMI:     24.90 Temp:     98.2 degrees F oral Pulse rate:   68 / minute Resp:     14 per minute BP sitting:   120 / 76  (left arm)  Vitals Entered By: Willy Eddy, LPN (April 15, 2010 9:09 AM) CC: roa labs Is Patient Diabetic? No   Primary Care Provider:  Stacie Glaze MD  CC:  roa labs.  History of Present Illness: lipid follow up at goals and effusion ( traumatic) in the left elbow   Hyperlipidemia Follow-Up      This is a 55 year old man who presents for Hyperlipidemia follow-up.  The patient denies muscle aches, GI upset, abdominal pain, flushing, itching, constipation, diarrhea, and fatigue.  The patient denies the following symptoms: chest pain/pressure, exercise intolerance, dypsnea, palpitations, syncope, and pedal edema.  Compliance with medications (by patient report) has been near 100%.  Dietary compliance has been good.  The patient reports exercising 3-4X per week.  Adjunctive measures currently used by the patient include fish oil supplements and weight reduction.    Preventive Screening-Counseling & Management  Alcohol-Tobacco     Smoking Status: never     Passive Smoke Exposure: no  Problems Prior to Update: 1)  Uns Advrs Eff Uns Rx Medicinal&biological Sbstnc  (ICD-995.20) 2)  Preventive Health Care  (ICD-V70.0) 3)  Plantar Wart  (ICD-078.12) 4)  Hypogonadism, Male  (ICD-257.2) 5)  Hypothyroidism  (ICD-244.9) 6)  Uns Advrs Eff Uns Rx Medicinal&biological Sbstnc  (ICD-995.20) 7)  Abnormal Weight Gain  (ICD-783.1) 8)  Testicular Cancer  (ICD-186.9) 9)  Hyperlipidemia  (ICD-272.4)  Current Problems (verified): 1)  Uns Advrs Eff Uns Rx Medicinal&biological Sbstnc  (ICD-995.20) 2)  Preventive Health Care  (ICD-V70.0) 3)  Plantar Wart  (ICD-078.12) 4)  Hypogonadism, Male  (ICD-257.2) 5)  Hypothyroidism   (ICD-244.9) 6)  Uns Advrs Eff Uns Rx Medicinal&biological Sbstnc  (ICD-995.20) 7)  Abnormal Weight Gain  (ICD-783.1) 8)  Testicular Cancer  (ICD-186.9) 9)  Hyperlipidemia  (ICD-272.4)  Medications Prior to Update: 1)  Co Q-10 Plus Red Yeast Rice 60-600 Mg Caps (Coenzyme Q10-Red Yeast Rice) .... Combination  ( Beyond Red Rice Yeast) 2)  Testim 1 % Gel (Testosterone) .Marland Kitchen.. 1 Packet Daily 3)  Antioxidant   Tabs (Multiple Vitamins-Minerals) .... At Times 4)  Adult Aspirin Low Strength 81 Mg  Tbdp (Aspirin) .Marland Kitchen.. 1 Once Daily 5)  Fish Oil 1000 Mg Caps (Omega-3 Fatty Acids) .... Two Tabs Bid 6)  Red Yeast Rice 600 Mg Tabs (Red Yeast Rice Extract) .Marland Kitchen.. 1 Once Daily  Current Medications (verified): 1)  Co Q-10 Plus Red Yeast Rice 60-600 Mg Caps (Coenzyme Q10-Red Yeast Rice) .... Combination  ( Beyond Red Rice Yeast) 2)  Testim 1 % Gel (Testosterone) .Marland Kitchen.. 1 Packet Daily 3)  Antioxidant   Tabs (Multiple Vitamins-Minerals) .... At Times 4)  Adult Aspirin Low Strength 81 Mg  Tbdp (Aspirin) .Marland Kitchen.. 1 Once Daily 5)  Fish Oil 1000 Mg Caps (Omega-3 Fatty Acids) .... Two Tabs Bid 6)  Red Yeast Rice 600 Mg Tabs (Red Yeast Rice Extract) .Marland Kitchen.. 1 Once Daily  Allergies (verified): 1)  ! 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  Past Medical History: Reviewed history from 07/31/2007 and no changes required. Hyperlipidmia testicular cancer Left testicular orchiectomy 08-Dec-2004 Hyperlipidemia  Past  Surgical History: Reviewed history from 07/31/2007 and no changes required. Orchiectomy L  Family History: Reviewed history from 07/31/2007 and no changes required. Father died 2000-12-08 epilepsy mother alive and well no family hx of cancer  Social History: Reviewed history from 07/31/2007 and no changes required. 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    Physical Exam  General:  Well-developed,well-nourished,in no acute distress; alert,appropriate and cooperative throughout examination Head:  Normocephalic and atraumatic without obvious abnormalities. No apparent alopecia or balding. Eyes:  pupils equal and pupils round.   Ears:  R ear normal and L ear normal.   Nose:  no external deformity and no nasal discharge.   Mouth:  good dentition and pharynx pink and moist.   Neck:  No deformities, masses, or tenderness noted. Lungs:  Normal respiratory effort, chest expands symmetrically. Lungs are clear to auscultation, no crackles or wheezes. Heart:  Normal rate and regular rhythm. S1 and S2 normal without gallop, murmur, click, rub or other extra sounds. Abdomen:  Bowel sounds positive,abdomen soft and non-tender without masses, organomegaly or hernias noted. Msk:  effusion of arm  Pulses:  R and L carotid,radial,femoral,dorsalis pedis and posterior tibial pulses are full and equal bilaterally Extremities:  No clubbing,  cyanosis, edema, or deformity noted with normal full range of motion of all joints.   Neurologic:  alert & oriented X3 and DTRs symmetrical and normal.     Impression & Recommendations:  Problem # 1:  HYPOTHYROIDISM (ICD-244.9) Assessment Unchanged stable Labs Reviewed: TSH: 1.44 (09/24/2009)    Chol: 189 (04/08/2010)   HDL: 48.20 (04/08/2010)   LDL: 132 (04/08/2010)   TG: 42.0 (04/08/2010)  Problem # 2:  HYPERLIPIDEMIA (ICD-272.4) Assessment: Unchanged the LDL is calculated  suspect te direct is at goal very goo HDL and total Labs Reviewed: SGOT: 24 (04/08/2010)   SGPT: 18 (04/08/2010)  Lipid Goals: Chol Goal: 200 (07/31/2007)   HDL Goal: 40 (07/31/2007)   LDL Goal: 160 (09/24/2008)   TG Goal: 150 (07/31/2007)  Prior 10 Yr Risk Heart Disease: 6 % (01/01/2009)   HDL:48.20 (04/08/2010), 49.40 (09/24/2009)  LDL:132 (04/08/2010), 133 (12/25/2008)  Chol:189 (04/08/2010), 209 (09/24/2009)  Trig:42.0 (04/08/2010), 78.0 (09/24/2009)  Problem # 3:  EFFUSION OF UPPER ARM JOINT (ICD-719.02) Assessment: New large effusion of left elbow no pain  post trauma Informed consent obtained and  the site was prepped with betadyne using a 20 guage needle 10 cc of serosauguenous fluid was removed and a coban wrap was placed  Orders: Joint Aspirate / Injection, Large (20610) Coban Wrap,  <3 in/yd ([id number])  Complete Medication List: 1)  Co Q-10 Plus Red Yeast Rice 60-600  Mg Caps (Coenzyme q10-red yeast rice) .... Combination  ( beyond red rice yeast) 2)  Testim 1 % Gel (Testosterone) .Marland Kitchen.. 1 packet daily 3)  Antioxidant Tabs (Multiple vitamins-minerals) .... At times 4)  Adult Aspirin Low Strength 81 Mg Tbdp (Aspirin) .Marland Kitchen.. 1 once daily 5)  Fish Oil 1000 Mg Caps (Omega-3 fatty acids) .... Two tabs bid 6)  Red Yeast Rice 600 Mg Tabs (Red yeast rice extract) .Marland Kitchen.. 1 once daily  Patient Instructions: 1)  april CPX

## 2010-08-09 ENCOUNTER — Other Ambulatory Visit: Payer: Self-pay | Admitting: Internal Medicine

## 2010-08-10 ENCOUNTER — Other Ambulatory Visit: Payer: Self-pay | Admitting: *Deleted

## 2010-09-23 ENCOUNTER — Other Ambulatory Visit: Payer: Self-pay

## 2010-09-23 DIAGNOSIS — D229 Melanocytic nevi, unspecified: Secondary | ICD-10-CM

## 2010-09-23 HISTORY — DX: Melanocytic nevi, unspecified: D22.9

## 2010-10-12 ENCOUNTER — Other Ambulatory Visit (INDEPENDENT_AMBULATORY_CARE_PROVIDER_SITE_OTHER): Payer: 59

## 2010-10-12 DIAGNOSIS — Z0000 Encounter for general adult medical examination without abnormal findings: Secondary | ICD-10-CM

## 2010-10-12 LAB — CBC WITH DIFFERENTIAL/PLATELET
Basophils Relative: 0.4 % (ref 0.0–3.0)
Eosinophils Relative: 1 % (ref 0.0–5.0)
Hemoglobin: 15.1 g/dL (ref 13.0–17.0)
Lymphocytes Relative: 18.3 % (ref 12.0–46.0)
Lymphs Abs: 1 10*3/uL (ref 0.7–4.0)
MCHC: 34.8 g/dL (ref 30.0–36.0)
MCV: 93.4 fl (ref 78.0–100.0)
Monocytes Absolute: 0.5 10*3/uL (ref 0.1–1.0)
Monocytes Relative: 8 % (ref 3.0–12.0)
Platelets: 217 10*3/uL (ref 150.0–400.0)
RDW: 14.3 % (ref 11.5–14.6)
WBC: 5.7 10*3/uL (ref 4.5–10.5)

## 2010-10-12 LAB — HEPATIC FUNCTION PANEL
Alkaline Phosphatase: 59 U/L (ref 39–117)
Bilirubin, Direct: 0.1 mg/dL (ref 0.0–0.3)
Total Bilirubin: 0.7 mg/dL (ref 0.3–1.2)
Total Protein: 6.6 g/dL (ref 6.0–8.3)

## 2010-10-12 LAB — BASIC METABOLIC PANEL
Calcium: 9.1 mg/dL (ref 8.4–10.5)
Chloride: 108 mEq/L (ref 96–112)
Creatinine, Ser: 1 mg/dL (ref 0.4–1.5)
Glucose, Bld: 82 mg/dL (ref 70–99)
Potassium: 4.1 mEq/L (ref 3.5–5.1)
Sodium: 142 mEq/L (ref 135–145)

## 2010-10-12 LAB — LIPID PANEL
HDL: 50.5 mg/dL (ref >39.00)
Total CHOL/HDL Ratio: 4
Triglycerides: 58 mg/dL (ref 0.0–149.0)
VLDL: 11.6 mg/dL (ref 0.0–40.0)

## 2010-10-12 LAB — POCT URINALYSIS DIPSTICK
Ketones, UA: NEGATIVE
Leukocytes, UA: NEGATIVE

## 2010-10-18 ENCOUNTER — Encounter: Payer: Self-pay | Admitting: Internal Medicine

## 2010-10-18 ENCOUNTER — Ambulatory Visit (INDEPENDENT_AMBULATORY_CARE_PROVIDER_SITE_OTHER): Payer: 59 | Admitting: Internal Medicine

## 2010-10-18 VITALS — BP 124/78 | HR 76 | Temp 98.6°F | Resp 14 | Ht 69.0 in | Wt 168.0 lb

## 2010-10-18 DIAGNOSIS — E785 Hyperlipidemia, unspecified: Secondary | ICD-10-CM

## 2010-10-18 DIAGNOSIS — Z0000 Encounter for general adult medical examination without abnormal findings: Secondary | ICD-10-CM

## 2010-10-18 DIAGNOSIS — E039 Hypothyroidism, unspecified: Secondary | ICD-10-CM

## 2010-10-18 DIAGNOSIS — E291 Testicular hypofunction: Secondary | ICD-10-CM

## 2010-10-18 MED ORDER — FENOFIBRATE MICRONIZED 134 MG PO CAPS: 134.0000 mg | ORAL_CAPSULE | Freq: Every day | ORAL | Status: DC

## 2010-10-18 NOTE — Progress Notes (Signed)
  Subjective:    Patient ID: Jeff Ware, male    DOB: 03-17-1956, 55 y.o.   MRN: [id number]  HPI This is a 55 year old white male who presents for complete physical examination.  His past medical history significant for testicular cancer status post left orchiectomy he has a history of hyperlipidemia and his risk factors for coronary disease include hyperlipidemia age and some family history. He denies any chest pain shortness of breath PND orthopnea he has been intolerant of statins in the past and is currently on red rice and fish oil is a history of hypogonadism due to testicular cancer and orchiectomy   Review of Systems  Constitutional: Negative for fever and fatigue.  HENT: Negative for hearing loss, congestion, neck pain and postnasal drip.   Eyes: Negative for discharge, redness and visual disturbance.  Respiratory: Negative for cough, shortness of breath and wheezing.   Cardiovascular: Negative for leg swelling.  Gastrointestinal: Negative for abdominal pain, constipation and abdominal distention.  Genitourinary: Negative for urgency and frequency.  Musculoskeletal: Negative for joint swelling and arthralgias.  Skin: Negative for color change and rash.  Neurological: Negative for weakness and light-headedness.  Hematological: Negative for adenopathy.  Psychiatric/Behavioral: Negative for behavioral problems.   Past Medical History  Diagnosis Date  . Hyperlipidemia   . Testicular cancer    Past Surgical History  Procedure Date  . Orchiectomy left 2006    reports that he has never smoked. He does not have any smokeless tobacco history on file. He reports that he does not drink alcohol or use illicit drugs. family history includes Heart disease in his father and Seizures in his father. No Known Allergies     Objective:   Physical Exam  Constitutional: He is oriented to person, place, and time. He appears well-developed and well-nourished.  HENT:  Head: Normocephalic  and atraumatic.  Eyes: Conjunctivae are normal. Pupils are equal, round, and reactive to light.  Neck: Normal range of motion. Neck supple.  Cardiovascular: Normal rate and regular rhythm.   Pulmonary/Chest: Effort normal and breath sounds normal.  Abdominal: Soft. Bowel sounds are normal.  Genitourinary: Prostate normal and penis normal.  Musculoskeletal: Normal range of motion.  Neurological: He is alert and oriented to person, place, and time.  Skin: Skin is warm and dry.  Psychiatric: His behavior is normal. Thought content normal.          Assessment & Plan:   Patient presents for yearly preventative medicine examination.   all immunizations and health maintenance protocols were reviewed with the patient and they are up to date with these protocols.   screening laboratory values were reviewed with the patient including screening of hyperlipidemia PSA renal function and hepatic function.   There medications past medical history social history problem list and allergies were reviewed in detail.   Goals were established with regard to weight loss exercise diet in compliance with medications

## 2010-10-18 NOTE — Assessment & Plan Note (Signed)
Patient has been statin intolerant in the past due to leg cramps even when we tried pulse therapy with statins at a lower dose.  His been taking red rice E. superficial and would not been able to achieve an LDL goal for him did a gym risk factors I would recommend that we get that LDL level lower. We have not tried fibrinates.... we'll give him samples of trilipix and check cholesterol in 2 months

## 2010-10-18 NOTE — Assessment & Plan Note (Signed)
The pts functioning has been normal

## 2010-10-18 NOTE — Assessment & Plan Note (Signed)
Stable TSH

## 2010-11-19 NOTE — Op Note (Signed)
NAMEMARQUEZE, Jeff Ware              ACCOUNT NO.:  [id number]   MEDICAL RECORD NO.:  [id number]          PATIENT TYPE:  AMB   LOCATION:  NESC                         FACILITY:  [facility]   PHYSICIAN:  Boston Service, M.D.DATE OF BIRTH:  12-08-1955   DATE OF PROCEDURE:  07/27/2004  DATE OF DISCHARGE:                                 OPERATIVE REPORT   PREOPERATIVE DIAGNOSIS:  Presumed seminoma, left testis.   POSTOPERATIVE DIAGNOSIS:  Presumed seminoma, left testis.   PROCEDURE:  Left inguinal orchiectomy.  Reference is made to CT scan, chest,  abdomen and pelvis, July 20, 2004, [facility], as  well as CT-guided core biopsy of retroperitoneal mass, July 21, 2004,  [facility].  Reference is made to pathology report from  July 21, 2004 as well as scrotal ultrasound from [facility],  July 23, 2004.   SURGEON:  Boston Service, M.D.   ANESTHESIA:  General.   DRAINS:  None.   COMPLICATIONS:  None.   DESCRIPTION OF PROCEDURE:  The patient was prepped and draped in the supine  position.  After institution of an adequate level of general anesthesia,  transverse incision made over left external internal ring, carried down  through the skin and subcutaneous tissue to reveal the fibers of the  external oblique.  Fibers were divided in the direction of the fibers to  expose the left inguinal spermatic cord, which was encircled at the pubic  tubercle.  Gentle dissection freed it from surrounding muscular attachments.  With gentle traction on the cord, testis was produced through the incision.  The proximal end of the cord was encircled with a Penrose drain.  A separate  set of instruments and towels were then used to separate the cord and testis  on the field.  With cord well-controlled, a small ellipse was excised from  the anterior surface of the testis and sent for frozen section.  I had  discussed this preoperatively with  patient, family members, oncology, Genene Churn. Cyndie Chime, M.D., and pathology, Jimmy Picket, M.D.  Dr. Rosezena Sensor  report returned focal seminoma, left testis.  Instruments were then changed,  gloves were then changed.  Cord was ligated at the internal inguinal ring,  ligated with 0 Vicryl tie x2, sent to pathology for permanent  section.  External oblique was closed in the direction of the fibers with a  running suture of 0 Vicryl, taking care to preserve the nerve, subcu was  reapproximated with 4-0 Dexon, skin was closed with skin staples and the  patient was returned to recovery in satisfactory condition.      RH/MEDQ  D:  07/27/2004  T:  07/27/2004  Job:  [id number]   cc:   Stacie Glaze, M.D. [facility]   Genene Churn. Cyndie Chime, M.D.  [address] [facility]  [place]  [place] [address]  Fax: [phone]

## 2010-11-19 NOTE — H&P (Signed)
NAMEDEVARIOUS, PAVEK NO.:  [id number]   MEDICAL RECORD NO.:  [id number]          PATIENT TYPE:  INP   LOCATION:  0457                         FACILITY:  [facility]   PHYSICIAN:  Stacie Glaze, M.D. LHCDATE OF BIRTH:  02-Mar-1956   DATE OF ADMISSION:  07/20/2004  DATE OF DISCHARGE:                                HISTORY & PHYSICAL   ADMISSION DIAGNOSIS:  Retroperitoneal mass, hydronephrosis, ureteral  obstruction.   CHIEF COMPLAINT:  Abdominal pain.   HISTORY OF PRESENT ILLNESS:  The patient is a 55 year old white male who  presented on July 19, 2004 in the office with left flank pain without  hematuria, episodes of nausea and vomiting x2 with constant dull pain in his  left side but intermittent sharp pains. Some difficulty with urination.  However no frank blood seen.  In the office he was evaluated with a  urinalysis, a CBC with differential and sent for a CT urogram.  Urinalysis  was negative for both white cells and red cells.  His physical examination  revealed diffuse tenderness in the abdomen increased on the left side  without palpable masses.  His rectal was heme negative and his prostate was  nontender and normal in size with slight increased texture. The CT urogram  was reported back showing a retroperitoneal mass consistent with  lymphadenopathy with possible ureteral obstruction and some hydronephrosis,  and the patient was informed to present to the office this morning.  After  discussing the patient's pain and his anxiety over the diagnosis, it was  decided to admit him for pain control as well as to rapidly progress with  appropriate diagnosis of this retroperitoneal mass.   PAST MEDICAL HISTORY:  Significant for history of degenerative joint disease  of the knee, otherwise he has a history of mild to moderate hyperlipidemia.   MEDICATIONS:  Red rice yeast, Niacin, over-the-counter supplements and  Glucosamine chondroitin.   FAMILY HISTORY:   Noncontributory. His parents are alive and well. There is  no history of colon cancer, prostate cancer, lymphoma or leukemia in the  family.   SOCIAL HISTORY:  The patient has never smoked and uses alcohol moderately.   LABORATORY DATA:  On the day prior to admission included a CBC with  differential with a slight left shift and a white count of 10.1 thousand.  Urinalysis was negative. CT report should be available in the computer,  revealing a 4.2 cm and 3.5 cm left retroperitoneal mass that is located  between the left kidney and the psoas muscle, compatible with adenopathy.  Moderate hydronephrosis due to obstruction of the uteropelvic junction.  There is also a  6 mm hemorrhagic cyst in the pole of the left kidney.   IMPRESSION:  Retroperitoneal adenopathy.   Differential diagnosis includes metastatic cancer and a primary lymphoma.   The patient is admitted to [facility].   PLAN:  1.  Urgent CT of the abdomen and pelvis with and without contrast to      determine the extent of the adenopathy and staging.  2.  Pain control. The patient will be  administered oral pain medications to      achieve pain control adequate for the patient's comfort. We will begin      with Vicodin and increase as needed for pain control.  3.  Ureteral obstruction. A urologist will be consulted to review the CAT      scans and determine if a stent should be placed for the ureteral      obstruction.  4.  Appropriate tumor markers will be drawn to rule out the possibility that      this could represent testicular cancer or metastatic colon cancer.      However, lymphoma is the primary working diagnosis.   It should be noted that he had a physical examination on April 16, 2004  with a normal white cell count and differential and normal platelet count,  and liver functions were completely normal at that time.      JEJ/MEDQ  D:  07/20/2004  T:  07/20/2004  Job:  [id number]   cc:   Kerry Kass, M.D. [facility]

## 2010-11-19 NOTE — Discharge Summary (Signed)
NAMESEVRIN, SALLY              ACCOUNT NO.:  [id number]   MEDICAL RECORD NO.:  [id number]          PATIENT TYPE:  INP   LOCATION:  0457                         FACILITY:  [facility]   PHYSICIAN:  Rene Paci, M.D. LHCDATE OF BIRTH:  June 07, 1956   DATE OF ADMISSION:  07/20/2004  DATE OF DISCHARGE:  07/21/2004                                 DISCHARGE SUMMARY   DISCHARGE DIAGNOSES:  1.  Left-sided retroperitoneal mass.  Rule out hydronephrosis.  CT-guided      biopsy done with pathology pending.  2.  Left-sided flank pain secondary to above, improved with Vicodin.   DISCHARGE MEDICATIONS:  Vicodin 5 mg 1 to 2 p.o. q.4h. p.r.n. pain.   DISPOSITION:  The patient is discharged home with adequate pain control with  Vicodin, tolerating p.o., understands plans for close followup to review  pathology and further workup.   CONSULTATIONS:  1.  Boston Service, M.D., ,urology, January 17.  2.  Interventional radiology, January 18.   FOLLOW UP:  Primary care physician in 48 hours, Dr. Darryll Capers, for  January 20 at 3:15 p.m. to review pathology results and further need for  workup or treatment.   SPECIAL INSTRUCTIONS:  The patient is instructed to return to the emergency  room or call the MD for worsening pain, fever, intolerance to p.o.   CONDITION ON DISCHARGE:  Medically stable.   HOSPITAL COURSE:  FLANK PAIN WITH RETROPERITONEAL PATHOLOGY:  The patient is a relatively  healthy 55 year old gentleman who saw his primary MD for flank pain.  A non-  contrasted CT was done the day prior to admission which showed a left-sided  retroperitoneal pathology with question of left hydronephrosis.  Because of  this and need for further workup as well as possible stent to relieve  hydronephrosis, he was referred to hospital admission and seen by urology.  A repeat CT scan with contrast showed that there was no true hydronephrosis  but rather a cyst associated with this mass.  Interventional  radiology was  then consulted to perform a CT-guided biopsy after this time.  The pain is  well controlled.  The patient may be discharged home with close outpatient  followup to review pathology.   Laboratory data was within normal limits.  PSA, CEA, CA19-9, and alpha  fetoprotein were all negative.  Sed rate was normal at 11.  CMET and CBC  with differential were also within normal range.     Vale  VL/MEDQ  D:  07/21/2004  T:  07/21/2004  Job:  [id number]

## 2011-01-17 ENCOUNTER — Other Ambulatory Visit (INDEPENDENT_AMBULATORY_CARE_PROVIDER_SITE_OTHER): Payer: 59

## 2011-01-17 ENCOUNTER — Other Ambulatory Visit: Payer: Self-pay | Admitting: Internal Medicine

## 2011-01-17 DIAGNOSIS — E785 Hyperlipidemia, unspecified: Secondary | ICD-10-CM

## 2011-03-15 ENCOUNTER — Other Ambulatory Visit: Payer: Self-pay | Admitting: Internal Medicine

## 2011-08-30 ENCOUNTER — Telehealth: Payer: Self-pay | Admitting: Internal Medicine

## 2011-08-30 MED ORDER — TESTOSTERONE 50 MG/5GM (1%) TD GEL: 5.0000 g | Freq: Every day | TRANSDERMAL | Status: DC

## 2011-08-30 NOTE — Telephone Encounter (Signed)
Pt req refill of TESTIM 50 MG/5GM GEL to [facility] in [place].

## 2011-10-13 ENCOUNTER — Other Ambulatory Visit: Payer: 59

## 2011-10-14 ENCOUNTER — Other Ambulatory Visit: Payer: 59

## 2011-10-21 ENCOUNTER — Encounter: Payer: 59 | Admitting: Internal Medicine

## 2011-11-17 ENCOUNTER — Emergency Department (HOSPITAL_COMMUNITY)
Admission: EM | Admit: 2011-11-17 | Discharge: 2011-11-17 | Disposition: A | Discharge status: Home / Self Care | Payer: 59 | Attending: Emergency Medicine | Admitting: Emergency Medicine

## 2011-11-17 ENCOUNTER — Encounter (HOSPITAL_COMMUNITY): Payer: Self-pay | Admitting: Emergency Medicine

## 2011-11-17 ENCOUNTER — Emergency Department (HOSPITAL_COMMUNITY): Payer: 59

## 2011-11-17 DIAGNOSIS — IMO0002 Reserved for concepts with insufficient information to code with codable children: Secondary | ICD-10-CM

## 2011-11-17 DIAGNOSIS — S81009A Unspecified open wound, unspecified knee, initial encounter: Secondary | ICD-10-CM | POA: Insufficient documentation

## 2011-11-17 DIAGNOSIS — W268XXA Contact with other sharp object(s), not elsewhere classified, initial encounter: Secondary | ICD-10-CM | POA: Insufficient documentation

## 2011-11-17 MED ORDER — OXYCODONE-ACETAMINOPHEN 5-325 MG PO TABS: 2.0000 | ORAL_TABLET | ORAL | Status: AC | PRN

## 2011-11-17 MED ORDER — TETANUS-DIPHTH-ACELL PERTUSSIS 5-2.5-18.5 LF-MCG/0.5 IM SUSP
0.5000 mL | Freq: Once | INTRAMUSCULAR | Status: AC
  Administered 2011-11-17: 0.5 mL via INTRAMUSCULAR
  Filled 2011-11-17: qty 0.5

## 2011-11-17 MED ORDER — CEPHALEXIN 500 MG PO CAPS: 500.0000 mg | ORAL_CAPSULE | Freq: Three times a day (TID) | ORAL | Status: AC

## 2011-11-17 MED ORDER — LIDOCAINE HCL 2 % IJ SOLN
10.0000 mL | Freq: Once | INTRAMUSCULAR | Status: AC
  Administered 2011-11-17: 200 mg via INTRADERMAL
  Filled 2011-11-17: qty 1

## 2011-11-17 NOTE — ED Provider Notes (Signed)
Medical screening examination/treatment/procedure(s) were performed by non-physician practitioner and as supervising physician I was immediately available for consultation/collaboration.   Loren Racer, MD 11/17/11 [phone]

## 2011-11-17 NOTE — ED Provider Notes (Signed)
History     CSN: [id number]  Arrival date & time 11/17/11  1906   First MD Initiated Contact with Patient 11/17/11 1953      Chief Complaint  Patient presents with  . Extremity Laceration    (Consider location/radiation/quality/duration/timing/severity/associated sxs/prior treatment) Patient is a 56 y.o. male presenting with skin laceration. The history is provided by the patient. No language interpreter was used.  Laceration  The incident occurred 3 to 5 hours ago. The laceration is located on the left leg. The laceration is 11-20 cm in size. The laceration mechanism was a a metal edge. The pain is at a severity of 5/10. The pain is moderate. The pain has been constant since onset. Possible foreign bodies include wood. His tetanus status is out of date.  [age]-year-old male here today after sustaining a 3 and half inch laceration from his chain saw injury prior to arrival. Bleeding is controlled. Patient has good sensation and movement below the injury 2+ left pedal pulse.  Past Medical History  Diagnosis Date  . Hyperlipidemia   . Testicular cancer     Past Surgical History  Procedure Date  . Orchiectomy left 2006    Family History  Problem Relation Age of Onset  . Heart disease Father   . Seizures Father     History  Substance Use Topics  . Smoking status: Never Smoker   . Smokeless tobacco: Not on file  . Alcohol Use: No      Review of Systems  Constitutional: Negative.   HENT: Negative.   Eyes: Negative.   Respiratory: Negative.   Cardiovascular: Negative.   Gastrointestinal: Negative.   Skin:       Laceration to LLE  Neurological: Negative.   Psychiatric/Behavioral: Negative.   All other systems reviewed and are negative.    Allergies  Review of patient's allergies indicates no known allergies.  Home Medications   Current Outpatient Rx  Name Route Sig Dispense Refill  . ASPIRIN 81 MG PO TABS Oral Take 81 mg by mouth daily.      . MULTIVITAMINS  PO TABS Oral Take 1 tablet by mouth daily.    . RED YEAST RICE 600 MG PO TABS Oral Take 600 mg by mouth daily.      . TESTOSTERONE 50 MG/5GM TD GEL Transdermal Place 5 g onto the skin daily. 150 g 5  . FENOFIBRATE MICRONIZED 134 MG PO CAPS Oral Take 1 capsule (134 mg total) by mouth daily before breakfast. 30 capsule 11  . OMEGA-3 FATTY ACIDS 1000 MG PO CAPS Oral Take 2 g by mouth 2 (two) times daily.        BP 122/67  Pulse 58  Resp 18  Ht 5\' 9"  (1.753 m)  Wt 165 lb (74.844 kg)  BMI 24.37 kg/m2  SpO2 96%  Physical Exam  Nursing note and vitals reviewed. Constitutional: He is oriented to person, place, and time. He appears well-developed and well-nourished.  HENT:  Head: Normocephalic.  Eyes: Conjunctivae and EOM are normal. Pupils are equal, round, and reactive to light.  Neck: Normal range of motion. Neck supple.  Cardiovascular: Normal rate.   Pulmonary/Chest: Effort normal.  Abdominal: Soft.  Musculoskeletal: Normal range of motion. He exhibits tenderness.  Neurological: He is alert and oriented to person, place, and time.  Skin: Skin is warm and dry.       3.5 inch laceration to LLE  Bleeding controlled good pedal pulse.    Psychiatric: He has a normal  mood and affect.    ED Course  LACERATION REPAIR Date/Time: 11/17/2011 9:26 PM Performed by: Remi Haggard Authorized by: Remi Haggard Consent: Verbal consent obtained. Written consent not obtained. Risks and benefits: risks, benefits and alternatives were discussed Consent given by: patient Patient understanding: patient states understanding of the procedure being performed Imaging studies: imaging studies available Patient identity confirmed: verbally with patient, arm band, provided demographic data and hospital-assigned identification number Time out: Immediately prior to procedure a "time out" was called to verify the correct patient, procedure, equipment, support staff and site/side marked as required. Body  area: lower extremity Location details: left lower leg Laceration length: 20 cm Contamination: The wound is contaminated. Foreign bodies: wood Tendon involvement: none Nerve involvement: none Vascular damage: no Anesthesia: local infiltration Local anesthetic: lidocaine 2% without epinephrine Anesthetic total: 10 ml Patient sedated: no Preparation: Patient was prepped and draped in the usual sterile fashion. Irrigation solution: saline Irrigation method: syringe Amount of cleaning: extensive Debridement: none Degree of undermining: none Technique: simple Comments: 10 staples used with close approximation   (including critical care time)  Labs Reviewed - No data to display Dg Tibia/fibula Left  11/17/2011  *RADIOLOGY REPORT*  Clinical Data: Lower leg laceration from the sella.  LEFT TIBIA AND FIBULA - 2 VIEW  Comparison: None.  Findings: Anterior laceration of the lower leg noted, without fracture or foreign body observed.  IMPRESSION:  1.  Anterior lower leg laceration, without acute osseous abnormality noted.  Original Report Authenticated By: Dellia Cloud, M.D.     No diagnosis found.    MDM  3-1/2 inch laceration repaired in the ER with a good approximation. Staples were used and staples were used. Tetanus updated. Patient was started on Keflex by mouth. He will followup with Dr. Lovell Sheehan or return here for staple remover in 7-10 days.        Remi Haggard, NP 11/17/11 2129

## 2011-11-17 NOTE — Discharge Instructions (Signed)
[name] 10 staples in your left lower leg today. We will also start you on an antibiotic Keflex start this as soon as possible. Keep area clean and dry for 24 hours then you can use antibacterial soap to wash it. Return to the ER for fever or. On drainage or any redness around the site. In 7-10 days she can go to your primary care provider Dr. Lovell Sheehan, urgent care, return to the ER to have her staples removed.No jogging until staples removed. Take percocet for pain if needed but do not drive with this.  You can also take ibuprofen with this medication.  Do not jog until the staples are removed to prevent the laceration from breaking back open. He also received  a tetanus shot today in the ER. You should not need another one for 10 years. Laceration Care, Adult A laceration is a cut that goes through all layers of the skin. The cut goes into the tissue beneath the skin. HOME CARE For stitches (sutures) or staples:  Keep the cut clean and dry.   If you have a bandage (dressing), change it at least once a day. Change the bandage if it gets wet or dirty, or as told by your doctor.   Wash the cut with soap and water 2 times a day. Rinse the cut with water. Pat it dry with a clean towel.   Put a thin layer of medicated cream on the cut as told by your doctor.   You may shower after the first 24 hours. Do not soak the cut in water until the stitches are removed.   Only take medicines as told by your doctor.   Have your stitches or staples removed as told by your doctor.  For skin adhesive strips:  Keep the cut clean and dry.   Do not get the strips wet. You may take a bath, but be careful to keep the cut dry.   If the cut gets wet, pat it dry with a clean towel.   The strips will fall off on their own. Do not remove the strips that are still stuck to the cut.  For wound glue:  You may shower or take baths. Do not soak or scrub the cut. Do not swim. Avoid heavy sweating until the glue falls  off on its own. After a shower or bath, pat the cut dry with a clean towel.   Do not put medicine on your cut until the glue falls off.   If you have a bandage, do not put tape over the glue.   Avoid lots of sunlight or tanning lamps until the glue falls off. Put sunscreen on the cut for the first year to reduce your scar.   The glue will fall off on its own. Do not pick at the glue.  You may need a tetanus shot if:  You cannot remember when you had your last tetanus shot.   You have never had a tetanus shot.  If you need a tetanus shot and you choose not to have one, you may get tetanus. Sickness from tetanus can be serious. GET HELP RIGHT AWAY IF:   Your pain does not get better with medicine.   Your arm, hand, leg, or foot loses feeling (numbness) or changes color.   Your cut is bleeding.   Your joint feels weak, or you cannot use your joint.   You have painful lumps on your body.   Your cut is red, puffy (  swollen), or painful.   You have a red line on the skin near the cut.   You have yellowish-white fluid (pus) coming from the cut.   You have a fever.   You have a bad smell coming from the cut or bandage.   Your cut breaks open before or after stitches are removed.   You notice something coming out of the cut, such as wood or glass.   You cannot move a finger or toe.  MAKE SURE YOU:   Understand these instructions.   Will watch your condition.   Will get help right away if you are not doing well or get worse.  Document Released: 12/07/2007 Document Revised: 06/09/2011 Document Reviewed: 12/14/2010 [facility] Patient Information 2012 [place], [place].Laceration Care, Adult A laceration is a cut that goes through all layers of the skin. The cut goes into the tissue beneath the skin. HOME CARE For stitches (sutures) or staples:  Keep the cut clean and dry.   If you have a bandage (dressing), change it at least once a day. Change the bandage if it gets wet or  dirty, or as told by your doctor.   Wash the cut with soap and water 2 times a day. Rinse the cut with water. Pat it dry with a clean towel.   Put a thin layer of medicated cream on the cut as told by your doctor.   You may shower after the first 24 hours. Do not soak the cut in water until the stitches are removed.   Only take medicines as told by your doctor.   Have your stitches or staples removed as told by your doctor.  For skin adhesive strips:  Keep the cut clean and dry.   Do not get the strips wet. You may take a bath, but be careful to keep the cut dry.   If the cut gets wet, pat it dry with a clean towel.   The strips will fall off on their own. Do not remove the strips that are still stuck to the cut.  For wound glue:  You may shower or take baths. Do not soak or scrub the cut. Do not swim. Avoid heavy sweating until the glue falls off on its own. After a shower or bath, pat the cut dry with a clean towel.   Do not put medicine on your cut until the glue falls off.   If you have a bandage, do not put tape over the glue.   Avoid lots of sunlight or tanning lamps until the glue falls off. Put sunscreen on the cut for the first year to reduce your scar.   The glue will fall off on its own. Do not pick at the glue.  You may need a tetanus shot if:  You cannot remember when you had your last tetanus shot.   You have never had a tetanus shot.  If you need a tetanus shot and you choose not to have one, you may get tetanus. Sickness from tetanus can be serious. GET HELP RIGHT AWAY IF:   Your pain does not get better with medicine.   Your arm, hand, leg, or foot loses feeling (numbness) or changes color.   Your cut is bleeding.   Your joint feels weak, or you cannot use your joint.   You have painful lumps on your body.   Your cut is red, puffy (swollen), or painful.   You have a red line on the skin near the  cut.   You have yellowish-white fluid (pus) coming  from the cut.   You have a fever.   You have a bad smell coming from the cut or bandage.   Your cut breaks open before or after stitches are removed.   You notice something coming out of the cut, such as wood or glass.   You cannot move a finger or toe.  MAKE SURE YOU:   Understand these instructions.   Will watch your condition.   Will get help right away if you are not doing well or get worse.  Document Released: 12/07/2007 Document Revised: 06/09/2011 Document Reviewed: 12/14/2010 [facility] Patient Information 2012 [place], [place]. Laceration Care, Adult A laceration is a cut that goes through all layers of the skin. The cut goes into the tissue beneath the skin. HOME CARE For stitches (sutures) or staples:  Keep the cut clean and dry.   If you have a bandage (dressing), change it at least once a day. Change the bandage if it gets wet or dirty, or as told by your doctor.   Wash the cut with soap and water 2 times a day. Rinse the cut with water. Pat it dry with a clean towel.   Put a thin layer of medicated cream on the cut as told by your doctor.   You may shower after the first 24 hours. Do not soak the cut in water until the stitches are removed.   Only take medicines as told by your doctor.   Have your stitches or staples removed as told by your doctor.  For skin adhesive strips:  Keep the cut clean and dry.   Do not get the strips wet. You may take a bath, but be careful to keep the cut dry.   If the cut gets wet, pat it dry with a clean towel.   The strips will fall off on their own. Do not remove the strips that are still stuck to the cut.  For wound glue:  You may shower or take baths. Do not soak or scrub the cut. Do not swim. Avoid heavy sweating until the glue falls off on its own. After a shower or bath, pat the cut dry with a clean towel.   Do not put medicine on your cut until the glue falls off.   If you have a bandage, do not put tape over the  glue.   Avoid lots of sunlight or tanning lamps until the glue falls off. Put sunscreen on the cut for the first year to reduce your scar.   The glue will fall off on its own. Do not pick at the glue.  You may need a tetanus shot if:  You cannot remember when you had your last tetanus shot.   You have never had a tetanus shot.  If you need a tetanus shot and you choose not to have one, you may get tetanus. Sickness from tetanus can be serious. GET HELP RIGHT AWAY IF:   Your pain does not get better with medicine.   Your arm, hand, leg, or foot loses feeling (numbness) or changes color.   Your cut is bleeding.   Your joint feels weak, or you cannot use your joint.   You have painful lumps on your body.   Your cut is red, puffy (swollen), or painful.   You have a red line on the skin near the cut.   You have yellowish-white fluid (pus) coming from the cut.  You have a fever.   You have a bad smell coming from the cut or bandage.   Your cut breaks open before or after stitches are removed.   You notice something coming out of the cut, such as wood or glass.   You cannot move a finger or toe.  MAKE SURE YOU:   Understand these instructions.   Will watch your condition.   Will get help right away if you are not doing well or get worse.  Document Released: 12/07/2007 Document Revised: 06/09/2011 Document Reviewed: 12/14/2010 [facility] Patient Information 2012 [place], [place].

## 2011-11-17 NOTE — ED Notes (Signed)
Per Pt: chain saw injury to left anterior shin, bone exposed but appears intact. Bleeding controlled. Distal CMS intact.

## 2011-11-24 ENCOUNTER — Ambulatory Visit (INDEPENDENT_AMBULATORY_CARE_PROVIDER_SITE_OTHER): Payer: 59 | Admitting: Family Medicine

## 2011-11-24 ENCOUNTER — Encounter: Payer: Self-pay | Admitting: Family Medicine

## 2011-11-24 VITALS — BP 114/64 | Temp 98.1°F | Wt 174.0 lb

## 2011-11-24 DIAGNOSIS — S81812A Laceration without foreign body, left lower leg, initial encounter: Secondary | ICD-10-CM

## 2011-11-24 DIAGNOSIS — S81809A Unspecified open wound, unspecified lower leg, initial encounter: Secondary | ICD-10-CM

## 2011-11-24 NOTE — Progress Notes (Signed)
  Subjective:    Patient ID: Jeff Ware, male    DOB: 1956/01/16, 56 y.o.   MRN: [id number]  HPI  Patient here for wound recheck left leg. Chainsaw accident one week ago. Long laceration lower leg. Went to emergency department. Tetanus booster given. Staples applied. Has continued exercising this week. No fever or chills. Applying topical antibiotic. No drainage. Minimal pain. Minimal swelling. No pain with ambulation.   Review of Systems  Constitutional: Negative for fever and chills.  Musculoskeletal: Negative for gait problem.       Objective:   Physical Exam  Constitutional: He appears well-developed and well-nourished.  Cardiovascular: Normal rate and regular rhythm.   Musculoskeletal:       Patient has long vertical wound left lower leg. Staples in place and these were removed without difficulty. No signs of secondary infection. Minimal edema. Nontender. No drainage          Assessment & Plan:  Healing wound left leg. Staples removed. Steri-Strips applied. Followup promptly for signs of secondary infection. Would avoid running for one more week

## 2011-12-20 ENCOUNTER — Other Ambulatory Visit (INDEPENDENT_AMBULATORY_CARE_PROVIDER_SITE_OTHER): Payer: 59

## 2011-12-20 DIAGNOSIS — Z0000 Encounter for general adult medical examination without abnormal findings: Secondary | ICD-10-CM

## 2011-12-20 LAB — CBC WITH DIFFERENTIAL/PLATELET
Basophils Absolute: 0 10*3/uL (ref 0.0–0.1)
Eosinophils Absolute: 0.1 10*3/uL (ref 0.0–0.7)
HCT: 45.1 % (ref 39.0–52.0)
Lymphs Abs: 1.1 10*3/uL (ref 0.7–4.0)
MCHC: 33.6 g/dL (ref 30.0–36.0)
MCV: 93.2 fl (ref 78.0–100.0)
Monocytes Absolute: 0.3 10*3/uL (ref 0.1–1.0)
Monocytes Relative: 6.8 % (ref 3.0–12.0)
Platelets: 208 10*3/uL (ref 150.0–400.0)
RDW: 14.3 % (ref 11.5–14.6)

## 2011-12-20 LAB — HEPATIC FUNCTION PANEL
Albumin: 4.3 g/dL (ref 3.5–5.2)
Total Bilirubin: 0.8 mg/dL (ref 0.3–1.2)

## 2011-12-20 LAB — POCT URINALYSIS DIPSTICK
Blood, UA: NEGATIVE
Ketones, UA: NEGATIVE
Protein, UA: NEGATIVE
Spec Grav, UA: 1.015
Urobilinogen, UA: 0.2

## 2011-12-20 LAB — LIPID PANEL
Cholesterol: 195 mg/dL (ref 0–200)
HDL: 50.5 mg/dL (ref >39.00)
LDL Cholesterol: 128 mg/dL — ABNORMAL HIGH (ref 0–99)
Triglycerides: 81 mg/dL (ref 0.0–149.0)
VLDL: 16.2 mg/dL (ref 0.0–40.0)

## 2011-12-20 LAB — BASIC METABOLIC PANEL
BUN: 17 mg/dL (ref 6–23)
CO2: 26 mEq/L (ref 19–32)
GFR: 75.9 mL/min (ref >60.00)
Glucose, Bld: 87 mg/dL (ref 70–99)
Potassium: 4.6 mEq/L (ref 3.5–5.1)

## 2011-12-20 LAB — TSH: TSH: 1.78 u[IU]/mL (ref 0.35–5.50)

## 2011-12-28 ENCOUNTER — Ambulatory Visit (INDEPENDENT_AMBULATORY_CARE_PROVIDER_SITE_OTHER): Payer: 59 | Admitting: Internal Medicine

## 2011-12-28 ENCOUNTER — Encounter: Payer: Self-pay | Admitting: Internal Medicine

## 2011-12-28 VITALS — BP 120/78 | HR 48 | Temp 98.0°F | Resp 14 | Ht 69.0 in | Wt 170.0 lb

## 2011-12-28 DIAGNOSIS — E291 Testicular hypofunction: Secondary | ICD-10-CM

## 2011-12-28 DIAGNOSIS — Z0000 Encounter for general adult medical examination without abnormal findings: Secondary | ICD-10-CM

## 2011-12-28 NOTE — Patient Instructions (Signed)
To get the levels a bit lower, skip Monday and friday

## 2011-12-28 NOTE — Progress Notes (Signed)
Subjective:    Patient ID: Jeff Ware, male    DOB: 20-Aug-1955, 56 y.o.   MRN: [id number]  HPI  CPX   Review of Systems  Constitutional: Negative for fever and fatigue.  HENT: Negative for hearing loss, congestion, neck pain and postnasal drip.   Eyes: Negative for discharge, redness and visual disturbance.  Respiratory: Negative for cough, shortness of breath and wheezing.   Cardiovascular: Negative for leg swelling.  Gastrointestinal: Negative for abdominal pain, constipation and abdominal distention.  Genitourinary: Negative for urgency and frequency.  Musculoskeletal: Negative for joint swelling and arthralgias.  Skin: Negative for color change and rash.  Neurological: Negative for weakness and light-headedness.  Hematological: Negative for adenopathy.  Psychiatric/Behavioral: Negative for behavioral problems.   Past Medical History  Diagnosis Date  . Hyperlipidemia   . Testicular cancer     History   Social History  . Marital Status: Married    Spouse Name: N/A    Number of Children: N/A  . Years of Education: N/A   Occupational History  . Not on file.   Social History Main Topics  . Smoking status: Never Smoker   . Smokeless tobacco: Not on file  . Alcohol Use: No  . Drug Use: No  . Sexually Active: Yes   Other Topics Concern  . Not on file   Social History Narrative  . No narrative on file    Past Surgical History  Procedure Date  . Orchiectomy left 2006    Family History  Problem Relation Age of Onset  . Heart disease Father   . Seizures Father     No Known Allergies  Current Outpatient Prescriptions on File Prior to Visit  Medication Sig Dispense Refill  . aspirin 81 MG tablet Take 81 mg by mouth daily.        . fish oil-omega-3 fatty acids 1000 MG capsule Take 2 g by mouth 2 (two) times daily.        . multivitamin (THERAGRAN) per tablet Take 1 tablet by mouth daily.      . Red Yeast Rice 600 MG TABS Take 600 mg by mouth daily.         Marland Kitchen testosterone (TESTIM) 50 MG/5GM GEL Place 5 g onto the skin daily.  150 g  5    BP 120/78  Pulse 48  Temp 98 F (36.7 C)  Resp 14  Ht 5\' 9"  (1.753 m)  Wt 170 lb (77.111 kg)  BMI 25.10 kg/m2        Objective:   Physical Exam  Constitutional: He is oriented to person, place, and time. He appears well-developed and well-nourished.  HENT:  Head: Normocephalic and atraumatic.  Eyes: Conjunctivae are normal. Pupils are equal, round, and reactive to light.  Neck: Normal range of motion. Neck supple.  Cardiovascular: Normal rate and regular rhythm.   Pulmonary/Chest: Effort normal and breath sounds normal.  Abdominal: Soft. Bowel sounds are normal.  Genitourinary: Rectum normal and prostate normal.  Musculoskeletal: Normal range of motion.  Neurological: He is alert and oriented to person, place, and time.  Skin: Skin is warm and dry.  Psychiatric: He has a normal mood and affect. His behavior is normal.          Assessment & Plan:   Patient presents for yearly preventative medicine examination.   all immunizations and health maintenance protocols were reviewed with the patient and they are up to date with these protocols.   screening laboratory values were  reviewed with the patient including screening of hyperlipidemia PSA renal function and hepatic function.   There medications past medical history social history problem list and allergies were reviewed in detail.   Goals were established with regard to weight loss exercise diet in compliance with medications

## 2011-12-28 NOTE — Assessment & Plan Note (Signed)
Cut the testim skip Monday and friday

## 2012-02-23 ENCOUNTER — Telehealth: Payer: Self-pay | Admitting: Internal Medicine

## 2012-02-23 NOTE — Telephone Encounter (Signed)
Patient called stating that he need a refill of his testim sent to [facility] in [place]. Please assist.

## 2012-02-23 NOTE — Telephone Encounter (Signed)
Due to the patient changing insurances this may require prior auth. Per pt. Please assist.

## 2012-02-23 NOTE — Telephone Encounter (Signed)
In progress. Thanks.

## 2012-02-27 ENCOUNTER — Telehealth: Payer: Self-pay | Admitting: Internal Medicine

## 2012-02-27 NOTE — Telephone Encounter (Signed)
Pt came by and wanted to know that status of getting Prior Auth for Testim via [employer]. Pt said that its been a wk and a half. Pls call.   Pt uses [facility] in [place].

## 2012-02-28 ENCOUNTER — Other Ambulatory Visit: Payer: Self-pay | Admitting: *Deleted

## 2012-02-28 MED ORDER — TESTOSTERONE 12.5 MG/ACT (1%) TD GEL: 4.0000 | Freq: Every day | TRANSDERMAL | Status: DC

## 2012-02-28 NOTE — Telephone Encounter (Signed)
PA in progress. 

## 2012-04-20 ENCOUNTER — Telehealth: Payer: Self-pay | Admitting: Internal Medicine

## 2012-04-20 MED ORDER — TESTOSTERONE 12.5 MG/ACT (1%) TD GEL: 4.0000 | Freq: Every day | TRANSDERMAL | Status: DC

## 2012-04-20 NOTE — Telephone Encounter (Signed)
Pt called and said that [facility] is suppose to have sent a refill req for pts Testosterone (ANDROGEL PUMP) 1.25 GM/ACT (1%) GEL . Pls send to [facility] mail order asap.

## 2012-04-20 NOTE — Telephone Encounter (Signed)
rx faxed

## 2012-04-23 ENCOUNTER — Other Ambulatory Visit: Payer: Self-pay | Admitting: *Deleted

## 2012-04-25 ENCOUNTER — Telehealth: Payer: Self-pay | Admitting: Internal Medicine

## 2012-04-25 NOTE — Telephone Encounter (Signed)
Patient came in today, for androgel it has expired, he needs Dr. Lovell Sheehan

## 2012-06-28 ENCOUNTER — Other Ambulatory Visit (INDEPENDENT_AMBULATORY_CARE_PROVIDER_SITE_OTHER): Payer: BC Managed Care – PPO

## 2012-06-28 DIAGNOSIS — E291 Testicular hypofunction: Secondary | ICD-10-CM

## 2012-06-28 DIAGNOSIS — R7989 Other specified abnormal findings of blood chemistry: Secondary | ICD-10-CM

## 2012-06-28 LAB — TESTOSTERONE: Testosterone: 305.03 ng/dL — ABNORMAL LOW (ref 350.00–890.00)

## 2012-09-18 ENCOUNTER — Other Ambulatory Visit: Payer: Self-pay | Admitting: Internal Medicine

## 2012-12-21 ENCOUNTER — Other Ambulatory Visit (INDEPENDENT_AMBULATORY_CARE_PROVIDER_SITE_OTHER): Payer: BC Managed Care – PPO

## 2012-12-21 DIAGNOSIS — Z0000 Encounter for general adult medical examination without abnormal findings: Secondary | ICD-10-CM

## 2012-12-21 LAB — BASIC METABOLIC PANEL
CO2: 28 mEq/L (ref 19–32)
Calcium: 9.1 mg/dL (ref 8.4–10.5)
GFR: 101.39 mL/min (ref >60.00)
Glucose, Bld: 87 mg/dL (ref 70–99)
Potassium: 4.1 mEq/L (ref 3.5–5.1)
Sodium: 139 mEq/L (ref 135–145)

## 2012-12-21 LAB — POCT URINALYSIS DIPSTICK
Glucose, UA: NEGATIVE
Leukocytes, UA: NEGATIVE
Nitrite, UA: NEGATIVE
Spec Grav, UA: 1.02
Urobilinogen, UA: 0.2

## 2012-12-21 LAB — HEPATIC FUNCTION PANEL
ALT: 22 U/L (ref 0–53)
AST: 24 U/L (ref 0–37)
Albumin: 4 g/dL (ref 3.5–5.2)
Alkaline Phosphatase: 66 U/L (ref 39–117)
Total Protein: 6.8 g/dL (ref 6.0–8.3)

## 2012-12-21 LAB — LIPID PANEL
LDL Cholesterol: 128 mg/dL — ABNORMAL HIGH (ref 0–99)
Total CHOL/HDL Ratio: 4
Triglycerides: 38 mg/dL (ref 0.0–149.0)

## 2012-12-21 LAB — TESTOSTERONE: Testosterone: 291.07 ng/dL — ABNORMAL LOW (ref 350.00–890.00)

## 2012-12-21 LAB — TSH: TSH: 1.23 u[IU]/mL (ref 0.35–5.50)

## 2012-12-21 NOTE — Addendum Note (Signed)
Addended by: Rossie Muskrat K on: 12/21/2012 11:10 AM   Modules accepted: Orders

## 2012-12-21 NOTE — Addendum Note (Signed)
Addended by: Bonnye Fava on: 12/21/2012 08:19 AM   Modules accepted: Orders

## 2012-12-22 LAB — CBC WITH DIFFERENTIAL/PLATELET
Basophils Absolute: 0 10*3/uL (ref 0.0–0.1)
Basophils Relative: 0.3 % (ref 0.0–3.0)
HCT: 41.1 % (ref 39.0–52.0)
Hemoglobin: 13.9 g/dL (ref 13.0–17.0)
Lymphocytes Relative: 16.7 % (ref 12.0–46.0)
Lymphs Abs: 0.8 10*3/uL (ref 0.7–4.0)
Monocytes Relative: 5 % (ref 3.0–12.0)
Neutro Abs: 3.8 10*3/uL (ref 1.4–7.7)
RBC: 4.32 Mil/uL (ref 4.22–5.81)
RDW: 14.3 % (ref 11.5–14.6)

## 2012-12-28 ENCOUNTER — Encounter: Payer: 59 | Admitting: Internal Medicine

## 2013-02-08 ENCOUNTER — Ambulatory Visit (INDEPENDENT_AMBULATORY_CARE_PROVIDER_SITE_OTHER): Payer: BC Managed Care – PPO | Admitting: Internal Medicine

## 2013-02-08 ENCOUNTER — Encounter: Payer: Self-pay | Admitting: Internal Medicine

## 2013-02-08 VITALS — BP 112/70 | HR 72 | Temp 98.3°F | Resp 16 | Ht 69.0 in | Wt 168.0 lb

## 2013-02-08 DIAGNOSIS — E291 Testicular hypofunction: Secondary | ICD-10-CM

## 2013-02-08 DIAGNOSIS — Z0000 Encounter for general adult medical examination without abnormal findings: Secondary | ICD-10-CM

## 2013-02-08 MED ORDER — TESTOSTERONE 12.5 MG/ACT (1%) TD GEL: 1.0000 "application " | Freq: Every day | TRANSDERMAL | Status: DC

## 2013-02-08 NOTE — Patient Instructions (Signed)
The patient is instructed to continue all medications as prescribed. Schedule followup with check out clerk upon leaving the clinic  

## 2013-02-08 NOTE — Progress Notes (Signed)
Subjective:    Patient ID: Jeff Ware, male    DOB: 1956/04/26, 57 y.o.   MRN: [id number]  HPI  CPX   Review of Systems  Constitutional: Negative for fever and fatigue.  HENT: Negative for hearing loss, congestion, neck pain and postnasal drip.   Eyes: Negative for discharge, redness and visual disturbance.  Respiratory: Negative for cough, shortness of breath and wheezing.   Cardiovascular: Negative for leg swelling.  Gastrointestinal: Negative for abdominal pain, constipation and abdominal distention.  Genitourinary: Negative for urgency and frequency.  Musculoskeletal: Negative for joint swelling and arthralgias.  Skin: Negative for color change and rash.  Neurological: Negative for weakness and light-headedness.  Hematological: Negative for adenopathy.  Psychiatric/Behavioral: Negative for behavioral problems.       Past Medical History  Diagnosis Date  . Hyperlipidemia   . Testicular cancer     History   Social History  . Marital Status: Married    Spouse Name: N/A    Number of Children: N/A  . Years of Education: N/A   Occupational History  . Not on file.   Social History Main Topics  . Smoking status: Never Smoker   . Smokeless tobacco: Not on file  . Alcohol Use: No  . Drug Use: No  . Sexually Active: Yes   Other Topics Concern  . Not on file   Social History Narrative  . No narrative on file    Past Surgical History  Procedure Laterality Date  . Orchiectomy left  2006    Family History  Problem Relation Age of Onset  . Heart disease Father   . Seizures Father     No Known Allergies  Current Outpatient Prescriptions on File Prior to Visit  Medication Sig Dispense Refill  . ANDROGEL PUMP 12.5 MG/ACT (1%) GEL APPLY 4 PUMPS EVERY DAY AS DIRECTED  150 g  0  . aspirin 81 MG tablet Take 81 mg by mouth daily.        . fish oil-omega-3 fatty acids 1000 MG capsule Take 2 g by mouth 2 (two) times daily.        . multivitamin (THERAGRAN) per  tablet Take 1 tablet by mouth daily.      . Red Yeast Rice 600 MG TABS Take 600 mg by mouth daily.         No current facility-administered medications on file prior to visit.    BP 112/70  Pulse 72  Temp(Src) 98.3 F (36.8 C)  Resp 16  Ht 5\' 9"  (1.753 m)  Wt 168 lb (76.204 kg)  BMI 24.8 kg/m2     Objective:   Physical Exam  Constitutional: He is oriented to person, place, and time. He appears well-developed and well-nourished.  HENT:  Head: Normocephalic and atraumatic.  Eyes: Conjunctivae are normal. Pupils are equal, round, and reactive to light.  Neck: Normal range of motion. Neck supple.  Cardiovascular: Normal rate and regular rhythm.   Pulmonary/Chest: Effort normal and breath sounds normal.  Abdominal: Soft. Bowel sounds are normal.  Genitourinary: Rectum normal and prostate normal.  Musculoskeletal: Normal range of motion.  Neurological: He is alert and oriented to person, place, and time.  Skin: Skin is warm and dry.  Psychiatric: He has a normal mood and affect. His behavior is normal.          Assessment & Plan:   Patient presents for yearly preventative medicine examination.   all immunizations and health maintenance protocols were reviewed with the patient  and they are up to date with these protocols.   screening laboratory values were reviewed with the patient including screening of hyperlipidemia PSA renal function and hepatic function.   There medications past medical history social history problem list and allergies were reviewed in detail.   Goals were established with regard to weight loss exercise diet in compliance with medications   Hypogonadism Nader level 291 did not use the androgel that AM Using every other day....  Change to   I pump a day

## 2013-03-28 ENCOUNTER — Other Ambulatory Visit: Payer: Self-pay | Admitting: *Deleted

## 2013-03-28 ENCOUNTER — Telehealth: Payer: Self-pay | Admitting: Internal Medicine

## 2013-03-28 NOTE — Telephone Encounter (Signed)
Already talked with pt

## 2013-03-28 NOTE — Telephone Encounter (Signed)
Pharm needs you to call concerning the pt testosterone gel . Pt brought in a pamphlet and want to try something else

## 2013-04-12 ENCOUNTER — Encounter: Payer: Self-pay | Admitting: Internal Medicine

## 2013-05-06 ENCOUNTER — Telehealth: Payer: Self-pay | Admitting: Internal Medicine

## 2013-05-06 NOTE — Telephone Encounter (Signed)
Patient Information:  Caller Name: Jeff Ware  Phone: [phone]  Patient: Jeff Ware, Jeff Ware  Gender: Male  DOB: 1956/03/05  Age: 57 Years  PCP: Darryll Capers (Adults only)  Office Follow Up:  Does the office need to follow up with this patient?: No  Instructions For The Office: N/A   Symptoms  Reason For Call & Symptoms: Patient reports right shoulder pain with deep breath.  Pain rated at 0 unless sneezes or deep breath at which time it is 5-6.  He feels this started after sleeping in an unusual position.  He does lifting with exercises, but states he does not feel this contributed to the pain.  Denies cough or shortness of breath.  Reviewed Health History In EMR: Yes  Reviewed Medications In EMR: Yes  Reviewed Allergies In EMR: Yes  Reviewed Surgeries / Procedures: Yes  Date of Onset of Symptoms: 05/05/2013  Treatments Tried: Advil x 1 tablet - unsure of any improvement  Treatments Tried Worked: No  Guideline(s) Used:  Shoulder Pain  Disposition Per Guideline:   Home Care  Reason For Disposition Reached:   Caused by overuse from recent vigorous activity (e.g., tennis, other sports, work Personnel officer)  Advice Given:  Reassurance - Shoulder Pain  Usually shoulder pain is not serious. You have told me that there is no redness, numbness, or swelling.  Causes of shoulder pain can include a strained muscle, a forgotten minor injury, and tendinitis.  Here is some care advice that should help.  Pain Medicines:  For pain relief, you can take either acetaminophen, ibuprofen, or naproxen.  They are over-the-counter (OTC) pain drugs. You can buy them at the drugstore.  Call Back If  Moderate pain (e.g. interferes with normal activities) lasts over 3 days  Mild pain lasts over 7 days  You become worse.  Apply Cold to the Area for First 48 Hours  Apply a cold pack or an ice bag (wrapped in a moist towel) to the area for 20 minutes. Repeat in 1 hour, then every 4  hours while awake.  Continue this for the first 48 hours after an injury (Reason: to reduce the swelling and pain).  Apply Heat to the Area:   Beginning 48 hours after an injury, apply a warm washcloth or heating pad for 10 minutes 3 times a day.  This will help increase blood flow and improve healing.  Rest:   You should try to avoid any exercise or activity that caused this pain for the next 3 days.  Patient Refused Recommendation:  Patient Refused Care Advice  Patient asks for appointment on 05/08/13. Advised  can only schedule for current or next day; he will need to call on 05/07/13 to schedule.  Caller voiced understanding.

## 2013-05-22 NOTE — Telephone Encounter (Signed)
Pt states his shoulder pain has gone away. No appt needed.

## 2013-10-21 ENCOUNTER — Ambulatory Visit: Payer: Self-pay | Admitting: Podiatry

## 2013-10-25 ENCOUNTER — Other Ambulatory Visit: Payer: Self-pay | Admitting: Internal Medicine

## 2013-10-30 ENCOUNTER — Ambulatory Visit (INDEPENDENT_AMBULATORY_CARE_PROVIDER_SITE_OTHER): Payer: BC Managed Care – PPO | Admitting: Podiatry

## 2013-10-30 ENCOUNTER — Ambulatory Visit (INDEPENDENT_AMBULATORY_CARE_PROVIDER_SITE_OTHER): Payer: BC Managed Care – PPO

## 2013-10-30 ENCOUNTER — Encounter: Payer: Self-pay | Admitting: Podiatry

## 2013-10-30 VITALS — BP 150/78 | HR 54 | Resp 16

## 2013-10-30 DIAGNOSIS — M779 Enthesopathy, unspecified: Secondary | ICD-10-CM

## 2013-10-30 DIAGNOSIS — L84 Corns and callosities: Secondary | ICD-10-CM

## 2013-10-30 DIAGNOSIS — M2040 Other hammer toe(s) (acquired), unspecified foot: Secondary | ICD-10-CM

## 2013-10-30 DIAGNOSIS — M2010 Hallux valgus (acquired), unspecified foot: Secondary | ICD-10-CM

## 2013-10-30 MED ORDER — TRIAMCINOLONE ACETONIDE 10 MG/ML IJ SUSP
10.0000 mg | Freq: Once | INTRAMUSCULAR | Status: AC
  Administered 2013-10-30: 10 mg

## 2013-10-30 NOTE — Progress Notes (Signed)
Subjective:     Patient ID: Jeff Ware, male   DOB: 1955-09-19, 58 y.o.   MRN: [id number]  HPI patient presents head of fifth metatarsal right stating that it's been very sore especially when he runs. Also complains of digital issues digits 23 left over right with keratotic lesions which are chronic in nature and nail thickenness   Review of Systems  All other systems reviewed and are negative.      Objective:   Physical Exam  Nursing note and vitals reviewed. Constitutional: He is oriented to person, place, and time.  Cardiovascular: Intact distal pulses.   Musculoskeletal: Normal range of motion.  Neurological: He is oriented to person, place, and time.  Skin: Skin is warm.   neurovascular status intact with muscle strength adequate and range of motion subtalar midtarsal joint within normal limits. Patient is found to have well-perfused digits and keratotic lesion fifth metatarsal head right it's painful when pressed and distal aspect digits 23 left over right foot     Assessment:     Plantarflexed metatarsal with capsulitis and keratotic lesion right and digital deformities with lesion formation 23 left over right    Plan:     H&P and x-ray reviewed. Educated on condition and today injected the plantar capsule right 3 mg Kenalog 5 mg Xylocaine Marcaine mixture and debrided the lesion fully along with lesions on the lesser toes. Reappoint when symptomatic

## 2013-10-30 NOTE — Progress Notes (Signed)
   Subjective:    Patient ID: Jeff Ware, male    DOB: [birth year], 58 y.o.   MRN: [id number]  HPI Comments: "I have a callus I want cut out"  Patient c/o tender callused area sub 5th MPJ right foot for few months. The area has thickened over time. He has tried trimming but make sore. Trouble walking some days. Would like it cut out.     Review of Systems  All other systems reviewed and are negative.      Objective:   Physical Exam        Assessment & Plan:

## 2013-12-02 ENCOUNTER — Telehealth: Payer: Self-pay | Admitting: Internal Medicine

## 2013-12-02 NOTE — Telephone Encounter (Signed)
Pt would like to switch to dr burchette. Can I sch? °

## 2013-12-02 NOTE — Telephone Encounter (Signed)
OK.  I have know him for several years.

## 2013-12-03 NOTE — Telephone Encounter (Signed)
Pt is aware.  

## 2014-01-31 ENCOUNTER — Other Ambulatory Visit (INDEPENDENT_AMBULATORY_CARE_PROVIDER_SITE_OTHER): Payer: BC Managed Care – PPO

## 2014-01-31 DIAGNOSIS — Z0000 Encounter for general adult medical examination without abnormal findings: Secondary | ICD-10-CM

## 2014-01-31 LAB — POCT URINALYSIS DIPSTICK
Bilirubin, UA: NEGATIVE
Blood, UA: NEGATIVE
GLUCOSE UA: NEGATIVE
LEUKOCYTES UA: NEGATIVE
NITRITE UA: NEGATIVE
SPEC GRAV UA: 1.02
UROBILINOGEN UA: 0.2
pH, UA: 5.5

## 2014-01-31 LAB — HEPATIC FUNCTION PANEL
ALBUMIN: 3.9 g/dL (ref 3.5–5.2)
ALT: 20 U/L (ref 0–53)
AST: 28 U/L (ref 0–37)
Alkaline Phosphatase: 71 U/L (ref 39–117)
Bilirubin, Direct: 0.1 mg/dL (ref 0.0–0.3)
TOTAL PROTEIN: 6.6 g/dL (ref 6.0–8.3)
Total Bilirubin: 0.9 mg/dL (ref 0.2–1.2)

## 2014-01-31 LAB — CBC WITH DIFFERENTIAL/PLATELET
BASOS PCT: 0.3 % (ref 0.0–3.0)
Basophils Absolute: 0 10*3/uL (ref 0.0–0.1)
EOS ABS: 0.1 10*3/uL (ref 0.0–0.7)
Eosinophils Relative: 1.1 % (ref 0.0–5.0)
HCT: 44.1 % (ref 39.0–52.0)
Hemoglobin: 14.8 g/dL (ref 13.0–17.0)
LYMPHS ABS: 1.1 10*3/uL (ref 0.7–4.0)
Lymphocytes Relative: 19.8 % (ref 12.0–46.0)
MCHC: 33.6 g/dL (ref 30.0–36.0)
MCV: 92.8 fl (ref 78.0–100.0)
MONO ABS: 0.8 10*3/uL (ref 0.1–1.0)
Monocytes Relative: 14.5 % — ABNORMAL HIGH (ref 3.0–12.0)
NEUTROS PCT: 64.3 % (ref 43.0–77.0)
Neutro Abs: 3.7 10*3/uL (ref 1.4–7.7)
PLATELETS: 203 10*3/uL (ref 150.0–400.0)
RBC: 4.75 Mil/uL (ref 4.22–5.81)
RDW: 14 % (ref 11.5–15.5)
WBC: 5.8 10*3/uL (ref 4.0–10.5)

## 2014-01-31 LAB — BASIC METABOLIC PANEL
BUN: 13 mg/dL (ref 6–23)
CHLORIDE: 104 meq/L (ref 96–112)
CO2: 28 meq/L (ref 19–32)
CREATININE: 1.1 mg/dL (ref 0.4–1.5)
Calcium: 9.1 mg/dL (ref 8.4–10.5)
GFR: 71.47 mL/min (ref >60.00)
Glucose, Bld: 69 mg/dL — ABNORMAL LOW (ref 70–99)
Potassium: 3.9 mEq/L (ref 3.5–5.1)
Sodium: 138 mEq/L (ref 135–145)

## 2014-01-31 LAB — LIPID PANEL
CHOL/HDL RATIO: 4
Cholesterol: 185 mg/dL (ref 0–200)
HDL: 43.9 mg/dL (ref >39.00)
LDL Cholesterol: 122 mg/dL — ABNORMAL HIGH (ref 0–99)
NONHDL: 141.1
TRIGLYCERIDES: 97 mg/dL (ref 0.0–149.0)
VLDL: 19.4 mg/dL (ref 0.0–40.0)

## 2014-01-31 LAB — TSH: TSH: 1.52 u[IU]/mL (ref 0.35–4.50)

## 2014-01-31 LAB — PSA: PSA: 1.06 ng/mL (ref 0.10–4.00)

## 2014-01-31 LAB — TESTOSTERONE: TESTOSTERONE: 345.4 ng/dL (ref 300.00–890.00)

## 2014-02-03 ENCOUNTER — Other Ambulatory Visit: Payer: BC Managed Care – PPO

## 2014-02-10 ENCOUNTER — Ambulatory Visit (INDEPENDENT_AMBULATORY_CARE_PROVIDER_SITE_OTHER): Payer: BC Managed Care – PPO | Admitting: Family Medicine

## 2014-02-10 ENCOUNTER — Encounter: Payer: BC Managed Care – PPO | Admitting: Internal Medicine

## 2014-02-10 ENCOUNTER — Encounter: Payer: Self-pay | Admitting: Family Medicine

## 2014-02-10 VITALS — BP 128/78 | HR 67 | Temp 97.7°F | Ht 69.0 in | Wt 167.0 lb

## 2014-02-10 DIAGNOSIS — Z0000 Encounter for general adult medical examination without abnormal findings: Secondary | ICD-10-CM

## 2014-02-10 NOTE — Progress Notes (Signed)
Pre visit review using our clinic review tool, if applicable. No additional management support is needed unless otherwise documented below in the visit note. 

## 2014-02-10 NOTE — Progress Notes (Signed)
   Subjective:    Patient ID: Jeff Ware, male    DOB: Nov 15, 1955, 58 y.o.   MRN: [id number]  HPI Patient seen for complete physical. He has history of testicular cancer several years ago. Has been in remission for several years. He has hypogonadism and is on testosterone replacement. Nonsmoker. Recent left rotator cuff repair. He's done well since then. Tetanus is up-to-date. Had colonoscopy around age 35. No recent stool changes. He exercises regularly.  Reviewed with no changes:  Past Medical History  Diagnosis Date  . Hyperlipidemia   . Testicular cancer    Past Surgical History  Procedure Laterality Date  . Orchiectomy left  2006    reports that he has never smoked. He does not have any smokeless tobacco history on file. He reports that he does not drink alcohol or use illicit drugs. family history includes Heart disease in his father; Seizures in his father. No Known Allergies    Review of Systems  Constitutional: Negative for fever, activity change, appetite change and fatigue.  HENT: Negative for congestion, ear pain and trouble swallowing.   Eyes: Negative for pain and visual disturbance.  Respiratory: Negative for cough, shortness of breath and wheezing.   Cardiovascular: Negative for chest pain and palpitations.  Gastrointestinal: Negative for nausea, vomiting, abdominal pain, diarrhea, constipation, blood in stool, abdominal distention and rectal pain.  Endocrine: Negative for polydipsia and polyuria.  Genitourinary: Negative for dysuria, hematuria and testicular pain.  Musculoskeletal: Negative for arthralgias and joint swelling.  Skin: Negative for rash.  Neurological: Negative for dizziness, syncope and headaches.  Hematological: Negative for adenopathy.  Psychiatric/Behavioral: Negative for confusion and dysphoric mood.       Objective:   Physical Exam  Constitutional: He is oriented to person, place, and time. He appears well-developed and  well-nourished. No distress.  HENT:  Head: Normocephalic and atraumatic.  Right Ear: External ear normal.  Left Ear: External ear normal.  Mouth/Throat: Oropharynx is clear and moist.  Eyes: Conjunctivae and EOM are normal. Pupils are equal, round, and reactive to light.  Neck: Normal range of motion. Neck supple. No thyromegaly present.  Cardiovascular: Normal rate, regular rhythm and normal heart sounds.   No murmur heard. Pulmonary/Chest: No respiratory distress. He has no wheezes. He has no rales.  Abdominal: Soft. Bowel sounds are normal. He exhibits no distension and no mass. There is no tenderness. There is no rebound and no guarding.  Genitourinary:  Prostate is normal size. No nodules. No rectal nodules/mass.  Musculoskeletal: He exhibits no edema.  Left shoulder is in a sling from recent left rotator cuff surgery  Lymphadenopathy:    He has no cervical adenopathy.  Neurological: He is alert and oriented to person, place, and time. He displays normal reflexes. No cranial nerve deficit.  Skin: No rash noted.  Psychiatric: He has a normal mood and affect.          Assessment & Plan:  Complete physical. Labs reviewed with no major concerns. Continue regular exercise habits. Tetanus up to date. He will check on insurance coverage for shingles vaccine. Repeat colonoscopy by age 13

## 2014-03-26 DIAGNOSIS — C4492 Squamous cell carcinoma of skin, unspecified: Secondary | ICD-10-CM

## 2014-03-26 DIAGNOSIS — D099 Carcinoma in situ, unspecified: Secondary | ICD-10-CM

## 2014-03-26 HISTORY — DX: Carcinoma in situ, unspecified: D09.9

## 2014-03-26 HISTORY — DX: Squamous cell carcinoma of skin, unspecified: C44.92

## 2014-04-23 ENCOUNTER — Telehealth: Payer: Self-pay | Admitting: Family Medicine

## 2014-04-23 NOTE — Telephone Encounter (Signed)
[facility], [place] - [address] is requesting re-fill on testosterone versabase (64ml) 5% (50mg /ml) gel

## 2014-04-24 MED ORDER — TESTOSTERONE 12.5 MG/ACT (1%) TD GEL: TRANSDERMAL | Status: DC

## 2014-04-24 NOTE — Telephone Encounter (Signed)
RX faxed to pharmacy.

## 2014-04-24 NOTE — Telephone Encounter (Signed)
Last refill 10/25/13 30g 5 refills Last visit 02/10/14

## 2014-04-24 NOTE — Telephone Encounter (Signed)
Refill for 6 months. 

## 2014-05-05 ENCOUNTER — Telehealth: Payer: Self-pay | Admitting: Family Medicine

## 2014-05-05 NOTE — Telephone Encounter (Signed)
Is it okay to change, i dont see that in patient chart.

## 2014-05-05 NOTE — Telephone Encounter (Signed)
Pt said [facility] received the RX  But is was for 1% and not 5%    Correct rx should be    testosterone versabase (70ml) 5% (50mg /ml) gel    [facility]

## 2014-05-06 NOTE — Telephone Encounter (Signed)
Make sure he gets exactly the same dose that he has been on.  [facility] should have that information and we need to get corrected in his med list.

## 2014-05-06 NOTE — Telephone Encounter (Signed)
Called in RX 

## 2014-05-07 ENCOUNTER — Other Ambulatory Visit: Payer: Self-pay

## 2014-05-09 ENCOUNTER — Other Ambulatory Visit: Payer: Self-pay | Admitting: *Deleted

## 2014-06-02 ENCOUNTER — Ambulatory Visit: Payer: BC Managed Care – PPO | Admitting: Podiatry

## 2014-06-02 ENCOUNTER — Encounter: Payer: Self-pay | Admitting: Podiatry

## 2014-06-02 ENCOUNTER — Ambulatory Visit (INDEPENDENT_AMBULATORY_CARE_PROVIDER_SITE_OTHER): Payer: BC Managed Care – PPO | Admitting: Podiatry

## 2014-06-02 VITALS — BP 133/72 | HR 54 | Resp 16

## 2014-06-02 DIAGNOSIS — M779 Enthesopathy, unspecified: Secondary | ICD-10-CM

## 2014-06-02 DIAGNOSIS — L84 Corns and callosities: Secondary | ICD-10-CM

## 2014-06-02 MED ORDER — TRIAMCINOLONE ACETONIDE 10 MG/ML IJ SUSP
10.0000 mg | Freq: Once | INTRAMUSCULAR | Status: AC
  Administered 2014-06-02: 10 mg

## 2014-06-05 NOTE — Progress Notes (Signed)
Subjective:     Patient ID: Jeff Ware, male   DOB: August 09, 1955, 58 y.o.   MRN: [id number]  HPI patient presents with pain underneath the fifth metatarsal head right with inflammation and fluid buildup and difficulty walking on this area   Review of Systems     Objective:   Physical Exam Neurovascular status intact with inflammation around the fifth metatarsal head right with fluid buildup and keratotic tissue formation    Assessment:     Inflammatory capsulitis fifth MPJ right with lesion formation    Plan:     H&P performed and today I injected the capsule of the fifth MPJ 3 mg Kenalog 5 mg Xylocaine and went ahead and did deep debridement of lesion with no iatrogenic bleeding noted

## 2014-08-18 ENCOUNTER — Telehealth: Payer: Self-pay

## 2014-08-18 NOTE — Telephone Encounter (Signed)
[facility] refill request for TESTOSTERONE VERSABASE (10ML) 5% (50MG /ML) GEL. APPLY 1ML TO A NON-HAIRY AREA ONCE EACH DAY AND RUB IN WELL

## 2014-08-18 NOTE — Telephone Encounter (Signed)
Is it okay to order. It is currently not on patient med list.

## 2014-08-18 NOTE — Telephone Encounter (Signed)
Yes.  Refill for 6 mo months

## 2014-08-18 NOTE — Telephone Encounter (Signed)
Rx called into the pharmacy. 

## 2015-01-15 ENCOUNTER — Telehealth: Payer: Self-pay | Admitting: Family Medicine

## 2015-01-15 NOTE — Telephone Encounter (Signed)
Pt requesting a refill of his testosterone to be called to [facility].

## 2015-01-15 NOTE — Telephone Encounter (Signed)
Refill okay. Recommend follow-up with medical months so we can recheck PSA and other labs

## 2015-01-16 ENCOUNTER — Other Ambulatory Visit: Payer: Self-pay | Admitting: *Deleted

## 2015-01-16 MED ORDER — TESTOSTERONE CYPIONATE 100 MG/ML IM SOLN: INTRAMUSCULAR | Status: DC

## 2015-01-16 NOTE — Telephone Encounter (Signed)
Rx called to [facility].  Patient is scheduled for a physical in August, can he have labs done at that time?

## 2015-01-16 NOTE — Telephone Encounter (Signed)
[facility] called to [facility] and the pt was informed.

## 2015-01-18 NOTE — Telephone Encounter (Signed)
Yes.  Make sure we add testosterone level to standard labs.

## 2015-01-19 ENCOUNTER — Other Ambulatory Visit: Payer: Self-pay | Admitting: *Deleted

## 2015-01-19 DIAGNOSIS — E291 Testicular hypofunction: Secondary | ICD-10-CM

## 2015-01-19 DIAGNOSIS — C6290 Malignant neoplasm of unspecified testis, unspecified whether descended or undescended: Secondary | ICD-10-CM

## 2015-01-19 NOTE — Telephone Encounter (Signed)
I called the pt and he is aware the Rx was called to his pharmacy and lab orders were entered for PSA and testosterone to check at his lab appt on 8/3.

## 2015-02-04 ENCOUNTER — Other Ambulatory Visit (INDEPENDENT_AMBULATORY_CARE_PROVIDER_SITE_OTHER): Payer: BLUE CROSS/BLUE SHIELD

## 2015-02-04 DIAGNOSIS — C6290 Malignant neoplasm of unspecified testis, unspecified whether descended or undescended: Secondary | ICD-10-CM

## 2015-02-04 DIAGNOSIS — Z0000 Encounter for general adult medical examination without abnormal findings: Secondary | ICD-10-CM

## 2015-02-04 DIAGNOSIS — E291 Testicular hypofunction: Secondary | ICD-10-CM

## 2015-02-04 LAB — BASIC METABOLIC PANEL
BUN: 28 mg/dL — AB (ref 6–23)
CO2: 27 meq/L (ref 19–32)
CREATININE: 1.03 mg/dL (ref 0.40–1.50)
Calcium: 9.3 mg/dL (ref 8.4–10.5)
Chloride: 106 mEq/L (ref 96–112)
GFR: 78.45 mL/min (ref >60.00)
Glucose, Bld: 70 mg/dL (ref 70–99)
POTASSIUM: 4.7 meq/L (ref 3.5–5.1)
SODIUM: 140 meq/L (ref 135–145)

## 2015-02-04 LAB — CBC WITH DIFFERENTIAL/PLATELET
Basophils Absolute: 0 10*3/uL (ref 0.0–0.1)
Basophils Relative: 0.5 % (ref 0.0–3.0)
EOS PCT: 2 % (ref 0.0–5.0)
Eosinophils Absolute: 0.1 10*3/uL (ref 0.0–0.7)
HCT: 43.1 % (ref 39.0–52.0)
HEMOGLOBIN: 14.8 g/dL (ref 13.0–17.0)
Lymphocytes Relative: 22.1 % (ref 12.0–46.0)
Lymphs Abs: 1 10*3/uL (ref 0.7–4.0)
MCHC: 34.3 g/dL (ref 30.0–36.0)
MCV: 91.6 fl (ref 78.0–100.0)
Monocytes Absolute: 0.3 10*3/uL (ref 0.1–1.0)
Monocytes Relative: 7.8 % (ref 3.0–12.0)
NEUTROS ABS: 3 10*3/uL (ref 1.4–7.7)
Neutrophils Relative %: 67.6 % (ref 43.0–77.0)
Platelets: 206 10*3/uL (ref 150.0–400.0)
RBC: 4.71 Mil/uL (ref 4.22–5.81)
RDW: 14.3 % (ref 11.5–15.5)
WBC: 4.5 10*3/uL (ref 4.0–10.5)

## 2015-02-04 LAB — HEPATIC FUNCTION PANEL
ALBUMIN: 4.1 g/dL (ref 3.5–5.2)
ALK PHOS: 60 U/L (ref 39–117)
ALT: 16 U/L (ref 0–53)
AST: 20 U/L (ref 0–37)
Bilirubin, Direct: 0.1 mg/dL (ref 0.0–0.3)
Total Bilirubin: 0.6 mg/dL (ref 0.2–1.2)
Total Protein: 6.7 g/dL (ref 6.0–8.3)

## 2015-02-04 LAB — PSA: PSA: 1 ng/mL (ref 0.10–4.00)

## 2015-02-04 LAB — LIPID PANEL
Cholesterol: 204 mg/dL — ABNORMAL HIGH (ref 0–200)
HDL: 53.7 mg/dL (ref >39.00)
LDL CALC: 138 mg/dL — AB (ref 0–99)
NonHDL: 150.57
Total CHOL/HDL Ratio: 4
Triglycerides: 62 mg/dL (ref 0.0–149.0)
VLDL: 12.4 mg/dL (ref 0.0–40.0)

## 2015-02-04 LAB — TESTOSTERONE: TESTOSTERONE: 408.14 ng/dL (ref 300.00–890.00)

## 2015-02-04 LAB — TSH: TSH: 1.33 u[IU]/mL (ref 0.35–4.50)

## 2015-02-11 ENCOUNTER — Encounter: Payer: Self-pay | Admitting: Family Medicine

## 2015-02-11 ENCOUNTER — Ambulatory Visit (INDEPENDENT_AMBULATORY_CARE_PROVIDER_SITE_OTHER): Payer: BLUE CROSS/BLUE SHIELD | Admitting: Family Medicine

## 2015-02-11 VITALS — BP 122/80 | HR 60 | Temp 98.2°F | Ht 69.0 in | Wt 167.0 lb

## 2015-02-11 DIAGNOSIS — Z0000 Encounter for general adult medical examination without abnormal findings: Secondary | ICD-10-CM

## 2015-02-11 DIAGNOSIS — Z23 Encounter for immunization: Secondary | ICD-10-CM | POA: Diagnosis not present

## 2015-02-11 NOTE — Progress Notes (Signed)
Pre visit review using our clinic review tool, if applicable. No additional management support is needed unless otherwise documented below in the visit note. 

## 2015-02-11 NOTE — Progress Notes (Signed)
   Subjective:    Patient ID: Jeff Ware, male    DOB: 1955-11-07, 59 y.o.   MRN: [id number]  HPI Patient several complete physical. He has history of testicle cancer 10 years ago was done well since that time. He has low testosterone and is on topical replacement. He exercises most days of week. Nonsmoker. Vaccines are up-to-date but no history of shingles and he wishes to proceed with that today. Needs repeat colonoscopy in 2 years. History of mild hyperlipidemia and takes red yeast rice extract occasionally  Past Medical History  Diagnosis Date  . Hyperlipidemia   . Testicular cancer    Past Surgical History  Procedure Laterality Date  . Orchiectomy left  2006    reports that he has never smoked. He does not have any smokeless tobacco history on file. He reports that he does not drink alcohol or use illicit drugs. family history includes Heart disease in his father; Seizures in his father. No Known Allergies    Review of Systems  Constitutional: Negative for fever, activity change, appetite change, fatigue and unexpected weight change.  HENT: Negative for congestion, ear pain and trouble swallowing.   Eyes: Negative for pain and visual disturbance.  Respiratory: Negative for cough, shortness of breath and wheezing.   Cardiovascular: Negative for chest pain and palpitations.  Gastrointestinal: Negative for nausea, vomiting, abdominal pain, diarrhea, constipation, blood in stool, abdominal distention and rectal pain.  Endocrine: Negative for polydipsia and polyuria.  Genitourinary: Negative for dysuria, hematuria and testicular pain.  Musculoskeletal: Negative for joint swelling and arthralgias.  Skin: Negative for rash.  Neurological: Negative for dizziness, syncope and headaches.  Hematological: Negative for adenopathy.  Psychiatric/Behavioral: Negative for confusion and dysphoric mood.       Objective:   Physical Exam  Constitutional: He is oriented to person, place,  and time. He appears well-developed and well-nourished. No distress.  HENT:  Head: Normocephalic and atraumatic.  Right Ear: External ear normal.  Left Ear: External ear normal.  Mouth/Throat: Oropharynx is clear and moist.  Eyes: Conjunctivae and EOM are normal. Pupils are equal, round, and reactive to light.  Neck: Normal range of motion. Neck supple. No thyromegaly present.  Cardiovascular: Normal rate, regular rhythm and normal heart sounds.   No murmur heard. Pulmonary/Chest: No respiratory distress. He has no wheezes. He has no rales.  Abdominal: Soft. Bowel sounds are normal. He exhibits no distension and no mass. There is no tenderness. There is no rebound and no guarding.  Musculoskeletal: He exhibits no edema.  Lymphadenopathy:    He has no cervical adenopathy.  Neurological: He is alert and oriented to person, place, and time. He displays normal reflexes. No cranial nerve deficit.  Skin: No rash noted.  Psychiatric: He has a normal mood and affect.          Assessment & Plan:  Complete physical. Patient requesting shingles vaccine. No contraindications. Labs reviewed. No major concerns. Tetanus up-to-date. Repeat colonoscopy in 2 years.

## 2015-02-25 ENCOUNTER — Telehealth: Payer: Self-pay | Admitting: Family Medicine

## 2015-02-25 NOTE — Telephone Encounter (Signed)
Pt request refill testosterone cypionate (DEPO-TESTOSTERONE) 100 MG/ML injection  Pt had cpe on 8/10 and states he was instructed to continue his testosterone. But there are [facility]ills at [facility],  [facility]

## 2015-02-25 NOTE — Telephone Encounter (Signed)
Rx called in to pharmacy. 

## 2015-02-25 NOTE — Telephone Encounter (Signed)
Refills OK. 

## 2015-02-25 NOTE — Telephone Encounter (Signed)
Last visit 02/11/15 Last refill 01/16/15 39ml 0 refill

## 2015-03-14 DIAGNOSIS — Z91030 Bee allergy status: Secondary | ICD-10-CM

## 2015-03-14 DIAGNOSIS — Z91038 Other insect allergy status: Secondary | ICD-10-CM | POA: Insufficient documentation

## 2015-03-31 DIAGNOSIS — T63441A Toxic effect of venom of bees, accidental (unintentional), initial encounter: Secondary | ICD-10-CM | POA: Diagnosis not present

## 2015-04-01 DIAGNOSIS — T63441A Toxic effect of venom of bees, accidental (unintentional), initial encounter: Secondary | ICD-10-CM | POA: Diagnosis not present

## 2015-04-02 ENCOUNTER — Telehealth: Payer: Self-pay | Admitting: Family Medicine

## 2015-04-02 MED ORDER — TESTOSTERONE CYPIONATE 100 MG/ML IM SOLN: INTRAMUSCULAR | Status: DC

## 2015-04-02 NOTE — Telephone Encounter (Signed)
Please make sure that he get 6 month quantity with this refill.

## 2015-04-02 NOTE — Telephone Encounter (Signed)
Pls advise.  

## 2015-04-02 NOTE — Telephone Encounter (Signed)
Called and spoke with pt and pt is aware rx was sent to pharmacy.

## 2015-04-02 NOTE — Telephone Encounter (Signed)
Pt request refill of the following: testosterone cypionate (DEPO-TESTOSTERONE) 100 MG/ML injection   Pt is asking that he not have to call every month for a refill. He is asking if this can be corrected    Phamacy: [facility]

## 2015-04-21 ENCOUNTER — Other Ambulatory Visit: Payer: Self-pay | Admitting: Dermatology

## 2015-04-29 ENCOUNTER — Ambulatory Visit (INDEPENDENT_AMBULATORY_CARE_PROVIDER_SITE_OTHER): Payer: BLUE CROSS/BLUE SHIELD

## 2015-04-29 DIAGNOSIS — T63441D Toxic effect of venom of bees, accidental (unintentional), subsequent encounter: Secondary | ICD-10-CM

## 2015-04-29 DIAGNOSIS — Z91038 Other insect allergy status: Secondary | ICD-10-CM | POA: Diagnosis not present

## 2015-04-29 DIAGNOSIS — Z91030 Bee allergy status: Secondary | ICD-10-CM

## 2015-06-13 ENCOUNTER — Encounter (HOSPITAL_COMMUNITY): Payer: Self-pay | Admitting: *Deleted

## 2015-06-13 ENCOUNTER — Emergency Department (HOSPITAL_COMMUNITY)
Admission: EM | Admit: 2015-06-13 | Discharge: 2015-06-13 | Disposition: A | Discharge status: Home / Self Care | Payer: BLUE CROSS/BLUE SHIELD | Attending: Emergency Medicine | Admitting: Emergency Medicine

## 2015-06-13 DIAGNOSIS — Y9289 Other specified places as the place of occurrence of the external cause: Secondary | ICD-10-CM | POA: Insufficient documentation

## 2015-06-13 DIAGNOSIS — Z8547 Personal history of malignant neoplasm of testis: Secondary | ICD-10-CM | POA: Diagnosis not present

## 2015-06-13 DIAGNOSIS — Y998 Other external cause status: Secondary | ICD-10-CM | POA: Insufficient documentation

## 2015-06-13 DIAGNOSIS — Z791 Long term (current) use of non-steroidal anti-inflammatories (NSAID): Secondary | ICD-10-CM | POA: Insufficient documentation

## 2015-06-13 DIAGNOSIS — E785 Hyperlipidemia, unspecified: Secondary | ICD-10-CM | POA: Diagnosis not present

## 2015-06-13 DIAGNOSIS — Z79899 Other long term (current) drug therapy: Secondary | ICD-10-CM | POA: Insufficient documentation

## 2015-06-13 DIAGNOSIS — IMO0002 Reserved for concepts with insufficient information to code with codable children: Secondary | ICD-10-CM

## 2015-06-13 DIAGNOSIS — Z7982 Long term (current) use of aspirin: Secondary | ICD-10-CM | POA: Insufficient documentation

## 2015-06-13 DIAGNOSIS — S61210A Laceration without foreign body of right index finger without damage to nail, initial encounter: Secondary | ICD-10-CM | POA: Insufficient documentation

## 2015-06-13 DIAGNOSIS — Y9389 Activity, other specified: Secondary | ICD-10-CM | POA: Diagnosis not present

## 2015-06-13 DIAGNOSIS — W1839XA Other fall on same level, initial encounter: Secondary | ICD-10-CM | POA: Diagnosis not present

## 2015-06-13 DIAGNOSIS — S6991XA Unspecified injury of right wrist, hand and finger(s), initial encounter: Secondary | ICD-10-CM | POA: Diagnosis present

## 2015-06-13 MED ORDER — LIDOCAINE HCL 1 % IJ SOLN
INTRAMUSCULAR | Status: AC
  Administered 2015-06-13: 21:00:00
  Filled 2015-06-13: qty 20

## 2015-06-13 NOTE — ED Notes (Signed)
PT states that he fell while carrying a 6x6 post and it landed on his right hand; pt states that he pulled his hand out and has a laceration to his rt index finger; bleeding controlled in triage

## 2015-06-13 NOTE — Discharge Instructions (Signed)
Please read attached information. If you experience any new or worsening signs or symptoms please return to the emergency room for evaluation. Please follow-up with your primary care provider or specialist as discussed. Please use medication prescribed only as directed and discontinue taking if you have any concerning signs or symptoms.   °

## 2015-06-13 NOTE — ED Provider Notes (Signed)
CSN: [id number]     Arrival date & time 06/13/15  1925 History   First MD Initiated Contact with Patient 06/13/15 1942     Chief Complaint  Patient presents with  . Laceration    HPI   59 year old male presents today with a laceration to his right hand. Patient reports he was carrying a piece of wood today when he fell causing a "tear" to his skin. He denies any penetration, reports rinsing off the wound and coming to the emergency room. Patient denies any pain to the surrounding area full active range of motion of the wrist hands and finger, bleeding controlled, tetanus up-to-date. No loss of distal sensation strength or motor function.  Past Medical History  Diagnosis Date  . Hyperlipidemia   . Testicular cancer [facility])    Past Surgical History  Procedure Laterality Date  . Orchiectomy left  2006  . Rotator cuff repair    . Knee surgery     Family History  Problem Relation Age of Onset  . Heart disease Father   . Seizures Father    Social History  Substance Use Topics  . Smoking status: Never Smoker   . Smokeless tobacco: None  . Alcohol Use: No    Review of Systems  All other systems reviewed and are negative.   Allergies  Review of patient's allergies indicates no known allergies.  Home Medications   Prior to Admission medications   Medication Sig Start Date End Date Taking? Authorizing Provider  aspirin 81 MG tablet Take 81 mg by mouth daily.      Historical Provider, MD  EPINEPHrine (EPIPEN 2-PAK) 0.3 mg/0.3 mL IJ SOAJ injection Inject into the muscle once.    Historical Provider, MD  fish oil-omega-3 fatty acids 1000 MG capsule Take 2 g by mouth 2 (two) times daily.      Historical Provider, MD  multivitamin [facility]) per tablet Take 1 tablet by mouth daily.    Historical Provider, MD  naproxen (NAPROSYN) 500 MG tablet Take 500 mg by mouth 2 (two) times daily with a meal.  01/28/14   Historical Provider, MD  NON FORMULARY TESTOSTERONE VERSABASE (10ML) 5 %  (50MG .ML) GEL  Apply 1ML to a non-hairy area once each day, and rub in well.  Call in prescription. [id number]    Historical Provider, MD  Red Yeast Rice 600 MG TABS Take 600 mg by mouth daily.      Historical Provider, MD  testosterone cypionate (DEPO-TESTOSTERONE) 100 MG/ML injection For IM use only 04/02/15   Eulas Post, MD   BP 181/44 mmHg  Pulse 50  Temp(Src) 97.3 F (36.3 C) (Oral)  Resp 19  SpO2 99%   Physical Exam  Constitutional: He is oriented to person, place, and time. He appears well-developed and well-nourished.  HENT:  Head: Normocephalic and atraumatic.  Eyes: Conjunctivae are normal. Pupils are equal, round, and reactive to light. Right eye exhibits no discharge. Left eye exhibits no discharge. No scleral icterus.  Neck: Normal range of motion. No JVD present. No tracheal deviation present.  Cardiovascular: Normal rate.   Pulmonary/Chest: Effort normal. No stridor.  Musculoskeletal:  1.5 cm laceration to the right lateral aspect of the second digit just distal to the MCP, bleeding controlled, no signs of foreign body, no deep space involvement  Neurological: He is alert and oriented to person, place, and time. Coordination normal.  Psychiatric: He has a normal mood and affect. His behavior is normal. Judgment and thought content normal.  Nursing note and vitals reviewed.   ED Course  Procedures (including critical care time)  LACERATION REPAIR Performed by: Elmer Ramp Authorized by: Elmer Ramp Consent: Verbal consent obtained. Risks and benefits: risks, benefits and alternatives were discussed Consent given by: patient Patient identity confirmed: provided demographic data Prepped and Draped in normal sterile fashion Wound explored  Laceration Location: Right hand  Laceration Length: 1.5cm  No Foreign Bodies seen or palpated  Anesthesia: local infiltration  Local anesthetic: lidocaine 1% 0 epinephrine  Anesthetic total: 3  ml  Irrigation method: syringe Amount of cleaning: standard  Skin closure: Simple   Number of sutures: 6   Technique: Simple interrupted   Patient tolerance: Patient tolerated the procedure well with no immediate complications. Labs Review Labs Reviewed - No data to display  Imaging Review No results found. I have personally reviewed and evaluated these images and lab results as part of my medical decision-making.   EKG Interpretation None      MDM   Final diagnoses:  None    Labs:  Imaging:  Consults:  Therapeutics:  Discharge Meds:   Assessment/Plan:  Patient presents with simple laceration, copiously irrigated, repaired here in the ED without complication. No deep space involvement, tetanus up-to-date. Patient given wound care instructions, follow-up with primary care in 7 days for suture removal. Patient verbalizes understanding and agreement to today's plan and had no further questions or concerns at time of discharge         Okey Regal, PA-C 06/13/15 2048  Blanchie Dessert, MD 06/14/15 1512

## 2015-06-24 ENCOUNTER — Ambulatory Visit (INDEPENDENT_AMBULATORY_CARE_PROVIDER_SITE_OTHER): Payer: BLUE CROSS/BLUE SHIELD

## 2015-06-24 DIAGNOSIS — J309 Allergic rhinitis, unspecified: Secondary | ICD-10-CM | POA: Diagnosis not present

## 2015-09-02 ENCOUNTER — Ambulatory Visit (INDEPENDENT_AMBULATORY_CARE_PROVIDER_SITE_OTHER): Payer: BLUE CROSS/BLUE SHIELD

## 2015-09-02 DIAGNOSIS — T63441D Toxic effect of venom of bees, accidental (unintentional), subsequent encounter: Secondary | ICD-10-CM | POA: Diagnosis not present

## 2015-09-21 ENCOUNTER — Encounter: Payer: Self-pay | Admitting: Podiatry

## 2015-09-21 ENCOUNTER — Ambulatory Visit (INDEPENDENT_AMBULATORY_CARE_PROVIDER_SITE_OTHER): Payer: BLUE CROSS/BLUE SHIELD | Admitting: Podiatry

## 2015-09-21 DIAGNOSIS — L600 Ingrowing nail: Secondary | ICD-10-CM

## 2015-09-21 DIAGNOSIS — M779 Enthesopathy, unspecified: Secondary | ICD-10-CM | POA: Diagnosis not present

## 2015-09-21 NOTE — Patient Instructions (Signed)

## 2015-09-22 NOTE — Progress Notes (Signed)
Subjective:     Patient ID: Jeff Ware, male   DOB: 1956/01/19, 60 y.o.   MRN: [id number]  HPI patient presents stating that I am having a lot of discomfort under this fifth metatarsal and also on the inside of the nail left my fifth toe that makes it hard to wear shoes. Patient's not been seen for a long time   Review of Systems     Objective:   Physical Exam Neurovascular status intact muscle strength adequate range of motion within normal limits with patient found to have inflamed keratotic lesion sub-fifth metatarsal right with fluid buildup and incurvated nailbed left hallux medial border that's tender when pressed    Assessment:     Inflammatory capsulitis fifth MPJ left with pain and ingrown toenail deformity left fifth nail medial side    Plan:     H&P conditions reviewed and at this time did proximal nerve block of the entire area. I then injected the fifth MPJ plantar 3 mg dexamethasone Kenalog and debrided lesion and remove the medial border the left fifth nail exposing the matrix and apply chemical phenol 3 applications 30 seconds followed by alcohol lavage and sterile dressing. Gave instructions on soaks and reappoint

## 2015-09-29 ENCOUNTER — Telehealth: Payer: Self-pay | Admitting: *Deleted

## 2015-09-29 NOTE — Telephone Encounter (Signed)
Left message for patient at [phone] (Home #) to check to see how they were doing from their ingrown toenail procedure that was performed on Monday, September 21, 2015. Waiting for a response.

## 2015-10-13 ENCOUNTER — Telehealth: Payer: Self-pay | Admitting: Family Medicine

## 2015-10-13 NOTE — Telephone Encounter (Signed)
Looks like pt has been on the transdermal treatment for years. Injectable testosterone was sent in for patient back in September. Please advise on which he should be on and how much to make sure I send in the correct dose. If you are unsure as well I will contact the patient to verify if he switched his treatment.

## 2015-10-13 NOTE — Telephone Encounter (Signed)
After

## 2015-10-13 NOTE — Telephone Encounter (Signed)
Pt request refill  testosterone cypionate (DEPO-TESTOSTERONE) 100 MG/ML injection   Notes to Pharmacy: TESTOSTERONE VERSABASE (10ML) 5 % (50MG .ML)- Apply 1 ml once daily as directed.  Pharmacy told pt this rx was inappropriate Please advise.  [facility]

## 2015-10-14 MED ORDER — TESTOSTERONE 12.5 MG/ACT (1%) TD GEL: TRANSDERMAL | Status: DC

## 2015-10-14 NOTE — Telephone Encounter (Signed)
Verbally called in for patient.  

## 2015-10-14 NOTE — Telephone Encounter (Signed)
i'm not sure how the injectable ended up on list  To my knowledge, he is taking the topical.  He has gotten topical in past, I believe, from a compounding pharmacy.  Would confirm with patient to make sure.

## 2015-10-30 ENCOUNTER — Ambulatory Visit (INDEPENDENT_AMBULATORY_CARE_PROVIDER_SITE_OTHER): Payer: BLUE CROSS/BLUE SHIELD | Admitting: *Deleted

## 2015-10-30 DIAGNOSIS — T63441D Toxic effect of venom of bees, accidental (unintentional), subsequent encounter: Secondary | ICD-10-CM | POA: Diagnosis not present

## 2015-12-31 ENCOUNTER — Ambulatory Visit (INDEPENDENT_AMBULATORY_CARE_PROVIDER_SITE_OTHER): Payer: BLUE CROSS/BLUE SHIELD

## 2015-12-31 DIAGNOSIS — T63441D Toxic effect of venom of bees, accidental (unintentional), subsequent encounter: Secondary | ICD-10-CM

## 2016-02-05 ENCOUNTER — Other Ambulatory Visit (INDEPENDENT_AMBULATORY_CARE_PROVIDER_SITE_OTHER): Payer: BLUE CROSS/BLUE SHIELD

## 2016-02-05 DIAGNOSIS — F5221 Male erectile disorder: Secondary | ICD-10-CM

## 2016-02-05 DIAGNOSIS — Z0000 Encounter for general adult medical examination without abnormal findings: Secondary | ICD-10-CM | POA: Diagnosis not present

## 2016-02-05 DIAGNOSIS — N529 Male erectile dysfunction, unspecified: Secondary | ICD-10-CM

## 2016-02-05 LAB — CBC WITH DIFFERENTIAL/PLATELET
Basophils Absolute: 0 10*3/uL (ref 0.0–0.1)
Basophils Relative: 0.6 % (ref 0.0–3.0)
EOS PCT: 1.7 % (ref 0.0–5.0)
Eosinophils Absolute: 0.1 10*3/uL (ref 0.0–0.7)
HCT: 41.5 % (ref 39.0–52.0)
Hemoglobin: 14.4 g/dL (ref 13.0–17.0)
LYMPHS ABS: 1.1 10*3/uL (ref 0.7–4.0)
Lymphocytes Relative: 21 % (ref 12.0–46.0)
MCHC: 34.6 g/dL (ref 30.0–36.0)
MCV: 91.4 fl (ref 78.0–100.0)
MONO ABS: 0.4 10*3/uL (ref 0.1–1.0)
MONOS PCT: 8.3 % (ref 3.0–12.0)
NEUTROS ABS: 3.6 10*3/uL (ref 1.4–7.7)
NEUTROS PCT: 68.4 % (ref 43.0–77.0)
PLATELETS: 214 10*3/uL (ref 150.0–400.0)
RBC: 4.53 Mil/uL (ref 4.22–5.81)
RDW: 13.8 % (ref 11.5–15.5)
WBC: 5.2 10*3/uL (ref 4.0–10.5)

## 2016-02-05 LAB — BASIC METABOLIC PANEL
BUN: 27 mg/dL — AB (ref 6–23)
CO2: 25 meq/L (ref 19–32)
Calcium: 9.2 mg/dL (ref 8.4–10.5)
Chloride: 107 mEq/L (ref 96–112)
Creatinine, Ser: 1.07 mg/dL (ref 0.40–1.50)
GFR: 74.82 mL/min (ref >60.00)
GLUCOSE: 85 mg/dL (ref 70–99)
POTASSIUM: 4.2 meq/L (ref 3.5–5.1)
SODIUM: 139 meq/L (ref 135–145)

## 2016-02-05 LAB — HEPATIC FUNCTION PANEL
ALBUMIN: 4.2 g/dL (ref 3.5–5.2)
ALT: 20 U/L (ref 0–53)
AST: 25 U/L (ref 0–37)
Alkaline Phosphatase: 60 U/L (ref 39–117)
BILIRUBIN TOTAL: 0.7 mg/dL (ref 0.2–1.2)
Bilirubin, Direct: 0.1 mg/dL (ref 0.0–0.3)
Total Protein: 6.9 g/dL (ref 6.0–8.3)

## 2016-02-05 LAB — LIPID PANEL
CHOLESTEROL: 207 mg/dL — AB (ref 0–200)
HDL: 55.7 mg/dL (ref >39.00)
LDL CALC: 140 mg/dL — AB (ref 0–99)
NonHDL: 151.25
TRIGLYCERIDES: 58 mg/dL (ref 0.0–149.0)
Total CHOL/HDL Ratio: 4
VLDL: 11.6 mg/dL (ref 0.0–40.0)

## 2016-02-05 LAB — TSH: TSH: 1.13 u[IU]/mL (ref 0.35–4.50)

## 2016-02-05 LAB — TESTOSTERONE: Testosterone: 391.01 ng/dL (ref 300.00–890.00)

## 2016-02-05 LAB — PSA: PSA: 1.24 ng/mL (ref 0.10–4.00)

## 2016-02-12 ENCOUNTER — Encounter: Payer: Self-pay | Admitting: Family Medicine

## 2016-02-12 ENCOUNTER — Ambulatory Visit (INDEPENDENT_AMBULATORY_CARE_PROVIDER_SITE_OTHER): Payer: BLUE CROSS/BLUE SHIELD | Admitting: Family Medicine

## 2016-02-12 VITALS — BP 100/80 | HR 54 | Temp 97.9°F | Ht 69.0 in | Wt 167.8 lb

## 2016-02-12 DIAGNOSIS — Z0000 Encounter for general adult medical examination without abnormal findings: Secondary | ICD-10-CM

## 2016-02-12 NOTE — Progress Notes (Signed)
Pre visit review using our clinic review tool, if applicable. No additional management support is needed unless otherwise documented below in the visit note. 

## 2016-02-12 NOTE — Progress Notes (Signed)
   Subjective:    Patient ID: Jeff Ware, male    DOB: [birth year], 60 y.o.   MRN: [id number]  HPI Patient here for physical exam. Had testicular cancer back in 2006 has been clear since then. He exercises almost daily. Never smoked. Takes no medications other than topical testosterone replacement. Immunizations up-to-date. Colonoscopy will need to be repeated next year  Family history, past medical history, and social history reviewed and as below  Past Medical History:  Diagnosis Date  . Hyperlipidemia   . Testicular cancer [facility])    Past Surgical History:  Procedure Laterality Date  . KNEE SURGERY    . orchiectomy left  2006  . ROTATOR CUFF REPAIR      reports that he has never smoked. He has never used smokeless tobacco. He reports that he does not drink alcohol or use drugs. family history includes Heart disease in his father; Seizures in his father. No Known Allergies    Review of Systems  Constitutional: Negative for activity change, appetite change, fatigue and fever.  HENT: Negative for congestion, ear pain and trouble swallowing.   Eyes: Negative for pain and visual disturbance.  Respiratory: Negative for cough, shortness of breath and wheezing.   Cardiovascular: Negative for chest pain and palpitations.  Gastrointestinal: Negative for abdominal distention, abdominal pain, blood in stool, constipation, diarrhea, nausea, rectal pain and vomiting.  Endocrine: Negative for polydipsia and polyuria.  Genitourinary: Negative for dysuria, hematuria and testicular pain.  Musculoskeletal: Negative for arthralgias and joint swelling.  Skin: Negative for rash.  Neurological: Negative for dizziness, syncope and headaches.  Hematological: Negative for adenopathy.  Psychiatric/Behavioral: Negative for confusion and dysphoric mood.       Objective:   Physical Exam  Constitutional: He is oriented to person, place, and time. He appears well-developed and well-nourished. No  distress.  HENT:  Head: Normocephalic and atraumatic.  Right Ear: External ear normal.  Left Ear: External ear normal.  Mouth/Throat: Oropharynx is clear and moist.  Eyes: Conjunctivae and EOM are normal. Pupils are equal, round, and reactive to light.  Neck: Normal range of motion. Neck supple. No thyromegaly present.  Cardiovascular: Normal rate, regular rhythm and normal heart sounds.   No murmur heard. Pulmonary/Chest: No respiratory distress. He has no wheezes. He has no rales.  Abdominal: Soft. Bowel sounds are normal. He exhibits no distension and no mass. There is no tenderness. There is no rebound and no guarding.  Musculoskeletal: He exhibits no edema.  Lymphadenopathy:    He has no cervical adenopathy.  Neurological: He is alert and oriented to person, place, and time. He displays normal reflexes. No cranial nerve deficit.  Skin: No rash noted.  Psychiatric: He has a normal mood and affect.          Assessment & Plan:  Complete physical. Labs reviewed with patient with no major concerns. Ten-year risk for CAD event 5.5%. No clear indication for statin use. Continue regular exercise habits. Reminder for flu vaccination. Repeat colonoscopy by next year.  Eulas Post MD [facility]

## 2016-02-12 NOTE — Progress Notes (Signed)
Patient ID: Jeff Ware, male   DOB: [birth year], 60 y.o.   MRN: [id number]

## 2016-02-25 DIAGNOSIS — T63441D Toxic effect of venom of bees, accidental (unintentional), subsequent encounter: Secondary | ICD-10-CM | POA: Diagnosis not present

## 2016-02-26 DIAGNOSIS — T63441D Toxic effect of venom of bees, accidental (unintentional), subsequent encounter: Secondary | ICD-10-CM | POA: Diagnosis not present

## 2016-02-29 ENCOUNTER — Ambulatory Visit (INDEPENDENT_AMBULATORY_CARE_PROVIDER_SITE_OTHER): Payer: BLUE CROSS/BLUE SHIELD

## 2016-02-29 DIAGNOSIS — T63441D Toxic effect of venom of bees, accidental (unintentional), subsequent encounter: Secondary | ICD-10-CM

## 2016-04-27 ENCOUNTER — Other Ambulatory Visit: Payer: Self-pay

## 2016-04-27 MED ORDER — TESTOSTERONE 12.5 MG/ACT (1%) TD GEL: TRANSDERMAL | 5 refills | Status: DC

## 2016-04-28 ENCOUNTER — Ambulatory Visit (INDEPENDENT_AMBULATORY_CARE_PROVIDER_SITE_OTHER): Payer: BLUE CROSS/BLUE SHIELD | Admitting: *Deleted

## 2016-04-28 DIAGNOSIS — T63441D Toxic effect of venom of bees, accidental (unintentional), subsequent encounter: Secondary | ICD-10-CM | POA: Diagnosis not present

## 2016-06-30 ENCOUNTER — Ambulatory Visit (INDEPENDENT_AMBULATORY_CARE_PROVIDER_SITE_OTHER): Payer: BLUE CROSS/BLUE SHIELD | Admitting: *Deleted

## 2016-06-30 DIAGNOSIS — T63441D Toxic effect of venom of bees, accidental (unintentional), subsequent encounter: Secondary | ICD-10-CM

## 2016-07-09 DIAGNOSIS — J069 Acute upper respiratory infection, unspecified: Secondary | ICD-10-CM | POA: Diagnosis not present

## 2016-08-30 DIAGNOSIS — L309 Dermatitis, unspecified: Secondary | ICD-10-CM | POA: Diagnosis not present

## 2016-08-30 DIAGNOSIS — D229 Melanocytic nevi, unspecified: Secondary | ICD-10-CM | POA: Diagnosis not present

## 2016-08-30 DIAGNOSIS — L570 Actinic keratosis: Secondary | ICD-10-CM | POA: Diagnosis not present

## 2016-09-13 ENCOUNTER — Ambulatory Visit (INDEPENDENT_AMBULATORY_CARE_PROVIDER_SITE_OTHER): Payer: BLUE CROSS/BLUE SHIELD | Admitting: *Deleted

## 2016-09-13 DIAGNOSIS — T63441D Toxic effect of venom of bees, accidental (unintentional), subsequent encounter: Secondary | ICD-10-CM

## 2016-11-03 ENCOUNTER — Ambulatory Visit (INDEPENDENT_AMBULATORY_CARE_PROVIDER_SITE_OTHER): Payer: BLUE CROSS/BLUE SHIELD | Admitting: Podiatry

## 2016-11-03 ENCOUNTER — Encounter: Payer: Self-pay | Admitting: Podiatry

## 2016-11-03 DIAGNOSIS — Q828 Other specified congenital malformations of skin: Secondary | ICD-10-CM

## 2016-11-03 DIAGNOSIS — M779 Enthesopathy, unspecified: Secondary | ICD-10-CM | POA: Diagnosis not present

## 2016-11-03 MED ORDER — TRIAMCINOLONE ACETONIDE 10 MG/ML IJ SUSP
10.0000 mg | Freq: Once | INTRAMUSCULAR | Status: AC
Start: 2016-11-03 — End: 2016-11-03
  Administered 2016-11-03: 10 mg

## 2016-11-05 NOTE — Progress Notes (Signed)
Subjective:    Patient ID: Jeff Ware, male   DOB: 61 y.o.   MRN: [id number]   HPI patient states this area my right foot has become very tender and I have several other callus formations    ROS      Objective:  Physical Exam Neurovascular status intact with inflamed capsule fifth MPJ right with fluid buildup and porokeratotic type lesion that's loose and and painful when pressed with keratotic lesion of the hallux bilateral    Assessment:     Inflammatory capsulitis of the fifth MPJ right with fluid buildup and lesion formation consistent with porokeratosis and plantarflex metatarsal along with lesions of the hallux bilateral    Plan:     Both conditions discussed and today I did a plantar capsular injection 3 mg dexamethasone Kenalog 5 mg Xylocaine and I went ahead and I debrided the lesion fully and gave instructions on physical therapy. Patient be seen back when needed and may ultimately require surgery on this area

## 2016-11-23 ENCOUNTER — Telehealth: Payer: Self-pay | Admitting: *Deleted

## 2016-11-23 MED ORDER — TESTOSTERONE 12.5 MG/ACT (1%) TD GEL: TRANSDERMAL | 5 refills | Status: DC

## 2016-11-23 NOTE — Telephone Encounter (Signed)
Rx called in 

## 2016-11-23 NOTE — Telephone Encounter (Signed)
Refill OK

## 2016-11-23 NOTE — Telephone Encounter (Signed)
[facility]  Testosterone 12.5 MG/ACT (1%) GEL Last refill 04/27/16 Last lab 02/05/16 Last office visit 02/12/16 Okay to fill?

## 2016-12-28 ENCOUNTER — Encounter: Payer: Self-pay | Admitting: Gastroenterology

## 2017-02-03 ENCOUNTER — Ambulatory Visit (INDEPENDENT_AMBULATORY_CARE_PROVIDER_SITE_OTHER): Payer: BLUE CROSS/BLUE SHIELD | Admitting: *Deleted

## 2017-02-03 DIAGNOSIS — T63441D Toxic effect of venom of bees, accidental (unintentional), subsequent encounter: Secondary | ICD-10-CM

## 2017-02-06 ENCOUNTER — Other Ambulatory Visit: Payer: BLUE CROSS/BLUE SHIELD

## 2017-02-13 ENCOUNTER — Encounter: Payer: Self-pay | Admitting: Gastroenterology

## 2017-02-13 ENCOUNTER — Encounter: Payer: Self-pay | Admitting: Family Medicine

## 2017-02-13 ENCOUNTER — Ambulatory Visit (INDEPENDENT_AMBULATORY_CARE_PROVIDER_SITE_OTHER): Payer: BLUE CROSS/BLUE SHIELD | Admitting: Family Medicine

## 2017-02-13 VITALS — BP 102/70 | HR 64 | Temp 98.0°F | Ht 69.0 in | Wt 171.8 lb

## 2017-02-13 DIAGNOSIS — Z0000 Encounter for general adult medical examination without abnormal findings: Secondary | ICD-10-CM

## 2017-02-13 DIAGNOSIS — E785 Hyperlipidemia, unspecified: Secondary | ICD-10-CM | POA: Diagnosis not present

## 2017-02-13 LAB — CBC WITH DIFFERENTIAL/PLATELET
BASOS ABS: 0 10*3/uL (ref 0.0–0.1)
Basophils Relative: 0.7 % (ref 0.0–3.0)
EOS ABS: 0 10*3/uL (ref 0.0–0.7)
Eosinophils Relative: 0.9 % (ref 0.0–5.0)
HCT: 43.5 % (ref 39.0–52.0)
Hemoglobin: 14.9 g/dL (ref 13.0–17.0)
LYMPHS ABS: 1.1 10*3/uL (ref 0.7–4.0)
LYMPHS PCT: 20 % (ref 12.0–46.0)
MCHC: 34.3 g/dL (ref 30.0–36.0)
MCV: 92.7 fl (ref 78.0–100.0)
MONOS PCT: 7.7 % (ref 3.0–12.0)
Monocytes Absolute: 0.4 10*3/uL (ref 0.1–1.0)
NEUTROS ABS: 4 10*3/uL (ref 1.4–7.7)
Neutrophils Relative %: 70.7 % (ref 43.0–77.0)
Platelets: 232 10*3/uL (ref 150.0–400.0)
RBC: 4.69 Mil/uL (ref 4.22–5.81)
RDW: 14.1 % (ref 11.5–15.5)
WBC: 5.6 10*3/uL (ref 4.0–10.5)

## 2017-02-13 LAB — BASIC METABOLIC PANEL
BUN: 18 mg/dL (ref 6–23)
CHLORIDE: 103 meq/L (ref 96–112)
CO2: 29 meq/L (ref 19–32)
CREATININE: 0.97 mg/dL (ref 0.40–1.50)
Calcium: 9.5 mg/dL (ref 8.4–10.5)
GFR: 83.51 mL/min (ref >60.00)
GLUCOSE: 70 mg/dL (ref 70–99)
Potassium: 4.1 mEq/L (ref 3.5–5.1)
Sodium: 139 mEq/L (ref 135–145)

## 2017-02-13 LAB — TESTOSTERONE: Testosterone: 392.48 ng/dL (ref 300.00–890.00)

## 2017-02-13 LAB — LIPID PANEL
CHOL/HDL RATIO: 4
Cholesterol: 203 mg/dL — ABNORMAL HIGH (ref 0–200)
HDL: 50.3 mg/dL (ref >39.00)
LDL CALC: 129 mg/dL — AB (ref 0–99)
NONHDL: 152.69
TRIGLYCERIDES: 117 mg/dL (ref 0.0–149.0)
VLDL: 23.4 mg/dL (ref 0.0–40.0)

## 2017-02-13 LAB — HEPATIC FUNCTION PANEL
ALK PHOS: 72 U/L (ref 39–117)
ALT: 16 U/L (ref 0–53)
AST: 19 U/L (ref 0–37)
Albumin: 4.5 g/dL (ref 3.5–5.2)
BILIRUBIN DIRECT: 0.1 mg/dL (ref 0.0–0.3)
BILIRUBIN TOTAL: 0.7 mg/dL (ref 0.2–1.2)
Total Protein: 6.6 g/dL (ref 6.0–8.3)

## 2017-02-13 LAB — TSH: TSH: 1.45 u[IU]/mL (ref 0.35–4.50)

## 2017-02-13 LAB — PSA: PSA: 1.5 ng/mL (ref 0.10–4.00)

## 2017-02-13 NOTE — Patient Instructions (Signed)
We will set up repeat colonoscopy We will call with lab results

## 2017-02-13 NOTE — Progress Notes (Signed)
Subjective:     Patient ID: Jeff Ware, male   DOB: 1956-04-01, 61 y.o.   MRN: [id number]  HPI Patient here for physical exam. He has history of testicular cancer over 10 years ago. He is now on testosterone replacement following orchiectomy. Generally feels well. Exercises regularly. No recent chest pains or other complaints. No other prescription medications. Last colonoscopy was 10 years ago. No history of hepatitis C screening. Low risk. Nonsmoker. Tetanus up-to-date  Past Medical History:  Diagnosis Date  . Hyperlipidemia   . Testicular cancer [facility])    Past Surgical History:  Procedure Laterality Date  . KNEE SURGERY    . orchiectomy left  2006  . ROTATOR CUFF REPAIR      reports that he has never smoked. He has never used smokeless tobacco. He reports that he does not drink alcohol or use drugs. family history includes Heart disease in his father; Seizures in his father. No Known Allergies  Review of Systems  Constitutional: Negative for activity change, appetite change, fatigue and fever.  HENT: Negative for congestion, ear pain and trouble swallowing.   Eyes: Negative for pain and visual disturbance.  Respiratory: Negative for cough, shortness of breath and wheezing.   Cardiovascular: Negative for chest pain and palpitations.  Gastrointestinal: Negative for abdominal distention, abdominal pain, blood in stool, constipation, diarrhea, nausea, rectal pain and vomiting.  Genitourinary: Negative for dysuria, hematuria and testicular pain.  Musculoskeletal: Negative for arthralgias and joint swelling.  Skin: Negative for rash.  Neurological: Negative for dizziness, syncope and headaches.  Hematological: Negative for adenopathy.  Psychiatric/Behavioral: Negative for confusion and dysphoric mood.       Objective:   Physical Exam  Constitutional: He is oriented to person, place, and time. He appears well-developed and well-nourished. No distress.  HENT:  Head:  Normocephalic and atraumatic.  Right Ear: External ear normal.  Left Ear: External ear normal.  Mouth/Throat: Oropharynx is clear and moist.  Eyes: Pupils are equal, round, and reactive to light. Conjunctivae and EOM are normal.  Neck: Normal range of motion. Neck supple. No thyromegaly present.  Cardiovascular: Normal rate, regular rhythm and normal heart sounds.   No murmur heard. Pulmonary/Chest: No respiratory distress. He has no wheezes. He has no rales.  Abdominal: Soft. Bowel sounds are normal. He exhibits no distension and no mass. There is no tenderness. There is no rebound and no guarding.  Genitourinary: Rectum normal and prostate normal.  Musculoskeletal: He exhibits no edema.  Lymphadenopathy:    He has no cervical adenopathy.  Neurological: He is alert and oriented to person, place, and time. He displays normal reflexes. No cranial nerve deficit.  Skin: No rash noted.  Psychiatric: He has a normal mood and affect.       Assessment:     Physical exam. Patient has remote history of testicular cancer. On testosterone replacement    Plan:     -Check screening labs and include hepatitis C antibody, PSA, testosterone level -Set up repeat colonoscopy -Continue regular exercise habits  Eulas Post MD [facility]

## 2017-02-14 LAB — HEPATITIS C ANTIBODY: HCV AB: NONREACTIVE

## 2017-02-23 DIAGNOSIS — M9903 Segmental and somatic dysfunction of lumbar region: Secondary | ICD-10-CM | POA: Diagnosis not present

## 2017-02-23 DIAGNOSIS — M47816 Spondylosis without myelopathy or radiculopathy, lumbar region: Secondary | ICD-10-CM | POA: Diagnosis not present

## 2017-02-27 DIAGNOSIS — M9903 Segmental and somatic dysfunction of lumbar region: Secondary | ICD-10-CM | POA: Diagnosis not present

## 2017-02-27 DIAGNOSIS — M47816 Spondylosis without myelopathy or radiculopathy, lumbar region: Secondary | ICD-10-CM | POA: Diagnosis not present

## 2017-02-28 DIAGNOSIS — M47816 Spondylosis without myelopathy or radiculopathy, lumbar region: Secondary | ICD-10-CM | POA: Diagnosis not present

## 2017-02-28 DIAGNOSIS — M9903 Segmental and somatic dysfunction of lumbar region: Secondary | ICD-10-CM | POA: Diagnosis not present

## 2017-03-01 DIAGNOSIS — M47816 Spondylosis without myelopathy or radiculopathy, lumbar region: Secondary | ICD-10-CM | POA: Diagnosis not present

## 2017-03-01 DIAGNOSIS — M9903 Segmental and somatic dysfunction of lumbar region: Secondary | ICD-10-CM | POA: Diagnosis not present

## 2017-03-13 DIAGNOSIS — M9903 Segmental and somatic dysfunction of lumbar region: Secondary | ICD-10-CM | POA: Diagnosis not present

## 2017-03-13 DIAGNOSIS — M47816 Spondylosis without myelopathy or radiculopathy, lumbar region: Secondary | ICD-10-CM | POA: Diagnosis not present

## 2017-03-15 DIAGNOSIS — M47816 Spondylosis without myelopathy or radiculopathy, lumbar region: Secondary | ICD-10-CM | POA: Diagnosis not present

## 2017-03-15 DIAGNOSIS — M9903 Segmental and somatic dysfunction of lumbar region: Secondary | ICD-10-CM | POA: Diagnosis not present

## 2017-03-22 DIAGNOSIS — M47816 Spondylosis without myelopathy or radiculopathy, lumbar region: Secondary | ICD-10-CM | POA: Diagnosis not present

## 2017-03-22 DIAGNOSIS — M9903 Segmental and somatic dysfunction of lumbar region: Secondary | ICD-10-CM | POA: Diagnosis not present

## 2017-03-29 DIAGNOSIS — M9903 Segmental and somatic dysfunction of lumbar region: Secondary | ICD-10-CM | POA: Diagnosis not present

## 2017-03-29 DIAGNOSIS — M47816 Spondylosis without myelopathy or radiculopathy, lumbar region: Secondary | ICD-10-CM | POA: Diagnosis not present

## 2017-04-04 ENCOUNTER — Ambulatory Visit (INDEPENDENT_AMBULATORY_CARE_PROVIDER_SITE_OTHER): Payer: BLUE CROSS/BLUE SHIELD | Admitting: *Deleted

## 2017-04-04 DIAGNOSIS — T63441D Toxic effect of venom of bees, accidental (unintentional), subsequent encounter: Secondary | ICD-10-CM

## 2017-04-06 ENCOUNTER — Encounter: Payer: Self-pay | Admitting: Gastroenterology

## 2017-04-06 ENCOUNTER — Ambulatory Visit (AMBULATORY_SURGERY_CENTER): Payer: Self-pay | Admitting: *Deleted

## 2017-04-06 VITALS — Ht 69.0 in | Wt 170.0 lb

## 2017-04-06 DIAGNOSIS — Z86010 Personal history of colonic polyps: Secondary | ICD-10-CM

## 2017-04-06 MED ORDER — NA SULFATE-K SULFATE-MG SULF 17.5-3.13-1.6 GM/177ML PO SOLN: 1.0000 [IU] | Freq: Once | ORAL | 0 refills | Status: AC

## 2017-04-06 NOTE — Progress Notes (Signed)
No egg or soy allergy known to patient  No issues with past sedation with any surgeries  or procedures, no intubation problems  No diet pills per patient No home 02 use per patient  No blood thinners per patient  Pt denies issues with constipation  No A fib or A flutter  EMMI video sent to pt's e mail  

## 2017-04-10 DIAGNOSIS — M9903 Segmental and somatic dysfunction of lumbar region: Secondary | ICD-10-CM | POA: Diagnosis not present

## 2017-04-10 DIAGNOSIS — M47816 Spondylosis without myelopathy or radiculopathy, lumbar region: Secondary | ICD-10-CM | POA: Diagnosis not present

## 2017-04-19 DIAGNOSIS — M9903 Segmental and somatic dysfunction of lumbar region: Secondary | ICD-10-CM | POA: Diagnosis not present

## 2017-04-19 DIAGNOSIS — M47816 Spondylosis without myelopathy or radiculopathy, lumbar region: Secondary | ICD-10-CM | POA: Diagnosis not present

## 2017-04-20 ENCOUNTER — Encounter: Payer: Self-pay | Admitting: Gastroenterology

## 2017-04-20 ENCOUNTER — Ambulatory Visit (AMBULATORY_SURGERY_CENTER): Payer: BLUE CROSS/BLUE SHIELD | Admitting: Gastroenterology

## 2017-04-20 VITALS — BP 130/53 | HR 37 | Temp 96.9°F | Resp 13 | Ht 69.0 in | Wt 170.0 lb

## 2017-04-20 DIAGNOSIS — K6389 Other specified diseases of intestine: Secondary | ICD-10-CM

## 2017-04-20 DIAGNOSIS — Z1212 Encounter for screening for malignant neoplasm of rectum: Secondary | ICD-10-CM | POA: Diagnosis not present

## 2017-04-20 DIAGNOSIS — Z1211 Encounter for screening for malignant neoplasm of colon: Secondary | ICD-10-CM | POA: Diagnosis not present

## 2017-04-20 DIAGNOSIS — K635 Polyp of colon: Secondary | ICD-10-CM

## 2017-04-20 DIAGNOSIS — D123 Benign neoplasm of transverse colon: Secondary | ICD-10-CM

## 2017-04-20 MED ORDER — SODIUM CHLORIDE 0.9 % IV SOLN: 500.0000 mL | INTRAVENOUS | Status: DC

## 2017-04-20 NOTE — Progress Notes (Signed)
Called to room to assist during endoscopic procedure.  Patient ID and intended procedure confirmed with present staff. Received instructions for my participation in the procedure from the performing physician.  

## 2017-04-20 NOTE — Progress Notes (Signed)
To PACU, VSS. Report to RN.tb 

## 2017-04-20 NOTE — Progress Notes (Signed)
Pt. Notes no change in his health history since pre-visit 04/06/2017.

## 2017-04-20 NOTE — Patient Instructions (Signed)
Handouts given: Polyps and Hemorrhoids.    YOU HAD AN ENDOSCOPIC PROCEDURE TODAY AT [facility]:   Refer to the procedure report that was given to you for any specific questions about what was found during the examination.  If the procedure report does not answer your questions, please call your gastroenterologist to clarify.  If you requested that your care partner not be given the details of your procedure findings, then the procedure report has been included in a sealed envelope for you to review at your convenience later.  YOU SHOULD EXPECT: Some feelings of bloating in the abdomen. Passage of more gas than usual.  Walking can help get rid of the air that was put into your GI tract during the procedure and reduce the bloating. If you had a lower endoscopy (such as a colonoscopy or flexible sigmoidoscopy) you may notice spotting of blood in your stool or on the toilet paper. If you underwent a bowel prep for your procedure, you may not have a normal bowel movement for a few days.  Please Note:  You might notice some irritation and congestion in your nose or some drainage.  This is from the oxygen used during your procedure.  There is no need for concern and it should clear up in a day or so.  SYMPTOMS TO REPORT IMMEDIATELY:   Following lower endoscopy (colonoscopy or flexible sigmoidoscopy):  Excessive amounts of blood in the stool  Significant tenderness or worsening of abdominal pains  Swelling of the abdomen that is new, acute  Fever of 100F or higher  Fever of 100F or higher   For urgent or emergent issues, a gastroenterologist can be reached at any hour by calling [phone].   DIET:  We do recommend a small meal at first, but then you may proceed to your regular diet.  Drink plenty of fluids but you should avoid alcoholic beverages for 24 hours.  ACTIVITY:  You should plan to take it easy for the rest of today and you should NOT DRIVE or use heavy machinery  until tomorrow (because of the sedation medicines used during the test).    FOLLOW UP: Our staff will call the number listed on your records the next business day following your procedure to check on you and address any questions or concerns that you may have regarding the information given to you following your procedure. If we do not reach you, we will leave a message.  However, if you are feeling well and you are not experiencing any problems, there is no need to return our call.  We will assume that you have returned to your regular daily activities without incident.  If any biopsies were taken you will be contacted by phone or by letter within the next 1-3 weeks.  Please call us at [phone] if you have not heard about the biopsies in 3 weeks.    SIGNATURES/CONFIDENTIALITY: You and/or your care partner have signed paperwork which will be entered into your electronic medical record.  These signatures attest to the fact that that the information above on your After Visit Summary has been reviewed and is understood.  Full responsibility of the confidentiality of this discharge information lies with you and/or your care-partner.

## 2017-04-20 NOTE — Op Note (Signed)
[place] Patient Name: Jeff Ware Procedure Date: 04/20/2017 7:52 AM MRN: [id number] Endoscopist: Ladene Artist , MD Age: 61 Referring MD:  Date of Birth: May 23, 1956 Gender: Male Account #: [id number] Procedure:                Colonoscopy Indications:              Screening for colorectal malignant neoplasm Medicines:                Monitored Anesthesia Care Procedure:                Pre-Anesthesia Assessment:                           - Prior to the procedure, a History and Physical                            was performed, and patient medications and                            allergies were reviewed. The patient's tolerance of                            previous anesthesia was also reviewed. The risks                            and benefits of the procedure and the sedation                            options and risks were discussed with the patient.                            All questions were answered, and informed consent                            was obtained. Prior Anticoagulants: The patient has                            taken no previous anticoagulant or antiplatelet                            agents. ASA Grade Assessment: II - A patient with                            mild systemic disease. After reviewing the risks                            and benefits, the patient was deemed in                            satisfactory condition to undergo the procedure.                           After obtaining informed consent, the colonoscope  was passed under direct vision. Throughout the                            procedure, the patient's blood pressure, pulse, and                            oxygen saturations were monitored continuously. The                            Model PCF-H190DL [id number]) scope was introduced                            through the anus and advanced to the the cecum,                            identified by  appendiceal orifice and ileocecal                            valve. The ileocecal valve, appendiceal orifice,                            and rectum were photographed. The quality of the                            bowel preparation was excellent. The colonoscopy                            was performed without difficulty. The patient                            tolerated the procedure well. Scope In: 8:13:00 AM Scope Out: 8:24:01 AM Scope Withdrawal Time: 0 hours 8 minutes 28 seconds  Total Procedure Duration: 0 hours 11 minutes 1 second  Findings:                 The perianal and digital rectal examinations were                            normal.                           A 6 mm polyp was found in the hepatic flexure. The                            polyp was sessile. The polyp was removed with a                            cold snare. Resection and retrieval were complete.                           Internal hemorrhoids were found during                            retroflexion. The hemorrhoids were small and Grade  I (internal hemorrhoids that do not prolapse).                           The exam was otherwise without abnormality on                            direct and retroflexion views. Complications:            No immediate complications. Estimated blood loss:                            None. Estimated Blood Loss:     Estimated blood loss: none. Impression:               - One 6 mm polyp at the hepatic flexure, removed                            with a cold snare. Resected and retrieved.                           - Internal hemorrhoids.                           - The examination was otherwise normal on direct                            and retroflexion views. Recommendation:           - Repeat colonoscopy in 5 years for surveillance if                            polyp is precancerous, otherwise 10 years.                           - Patient has a contact number  available for                            emergencies. The signs and symptoms of potential                            delayed complications were discussed with the                            patient. Return to normal activities tomorrow.                            Written discharge instructions were provided to the                            patient.                           - Resume previous diet.                           - Continue present medications.                           -  Await pathology results. Ladene Artist, MD 04/20/2017 8:26:27 AM This report has been signed electronically.

## 2017-04-21 ENCOUNTER — Telehealth: Payer: Self-pay

## 2017-04-21 NOTE — Telephone Encounter (Signed)
  Follow up Call-  Call Jahlisa Rossitto number 04/20/2017  Post procedure Call Wen [place] phone  # [phone]  Permission to leave phone message Yes  Some recent data might be hidden     Patient questions:  Do you have a fever, pain , or abdominal swelling? No. Pain Score  0 *  Have you tolerated food without any problems? Yes.    Have you been able to return to your normal activities? Yes.    Do you have any questions about your discharge instructions: Diet   No. Medications  No. Follow up visit  No.  Do you have questions or concerns about your Care? No.  Actions: * If pain score is 4 or above: No action needed, pain <4.

## 2017-04-26 DIAGNOSIS — M47816 Spondylosis without myelopathy or radiculopathy, lumbar region: Secondary | ICD-10-CM | POA: Diagnosis not present

## 2017-04-26 DIAGNOSIS — M9903 Segmental and somatic dysfunction of lumbar region: Secondary | ICD-10-CM | POA: Diagnosis not present

## 2017-04-28 ENCOUNTER — Encounter: Payer: Self-pay | Admitting: Gastroenterology

## 2017-05-08 DIAGNOSIS — M9903 Segmental and somatic dysfunction of lumbar region: Secondary | ICD-10-CM | POA: Diagnosis not present

## 2017-05-08 DIAGNOSIS — M47816 Spondylosis without myelopathy or radiculopathy, lumbar region: Secondary | ICD-10-CM | POA: Diagnosis not present

## 2017-05-17 DIAGNOSIS — H5203 Hypermetropia, bilateral: Secondary | ICD-10-CM | POA: Diagnosis not present

## 2017-06-05 ENCOUNTER — Ambulatory Visit (INDEPENDENT_AMBULATORY_CARE_PROVIDER_SITE_OTHER): Payer: BLUE CROSS/BLUE SHIELD | Admitting: *Deleted

## 2017-06-05 DIAGNOSIS — T63441D Toxic effect of venom of bees, accidental (unintentional), subsequent encounter: Secondary | ICD-10-CM

## 2017-07-10 ENCOUNTER — Other Ambulatory Visit: Payer: Self-pay | Admitting: Family Medicine

## 2017-07-11 NOTE — Telephone Encounter (Signed)
Refills OK. 

## 2017-07-11 NOTE — Telephone Encounter (Signed)
Testosterone 12.5 MG/ACT (1%) GEL  Patient here for Ov in 02/2017 Okay to refill?

## 2017-07-14 NOTE — Telephone Encounter (Signed)
Copied from [place] [phone] [phone]. Topic: Quick Communication - Rx Refill/Question >> Jul 14, 2017 12:35 PM Patrice Paradise wrote: Medication: Testosterone 12.5 MG/ACT (1%) GEL   (Pharmacy faxed over the request on  07/10/17)  Has the patient contacted their pharmacy? Yes.    (Agent: If no, request that the patient contact the pharmacy for the refill.)  Preferred Pharmacy (with phone number or street name):   [place], [place] [address] [place] [address] Phone: [phone] Fax: [phone]    Agent: Please be advised that RX refills may take up to 3 business days. We ask that you follow-up with your pharmacy.

## 2017-07-14 NOTE — Telephone Encounter (Signed)
Testosterone refill

## 2017-08-08 ENCOUNTER — Ambulatory Visit (INDEPENDENT_AMBULATORY_CARE_PROVIDER_SITE_OTHER): Payer: BLUE CROSS/BLUE SHIELD | Admitting: *Deleted

## 2017-08-08 DIAGNOSIS — T63441D Toxic effect of venom of bees, accidental (unintentional), subsequent encounter: Secondary | ICD-10-CM | POA: Diagnosis not present

## 2017-08-14 ENCOUNTER — Ambulatory Visit (INDEPENDENT_AMBULATORY_CARE_PROVIDER_SITE_OTHER): Payer: BLUE CROSS/BLUE SHIELD | Admitting: Podiatry

## 2017-08-14 ENCOUNTER — Ambulatory Visit (INDEPENDENT_AMBULATORY_CARE_PROVIDER_SITE_OTHER): Payer: BLUE CROSS/BLUE SHIELD

## 2017-08-14 ENCOUNTER — Encounter: Payer: Self-pay | Admitting: Podiatry

## 2017-08-14 ENCOUNTER — Other Ambulatory Visit: Payer: Self-pay | Admitting: Podiatry

## 2017-08-14 DIAGNOSIS — M79671 Pain in right foot: Secondary | ICD-10-CM | POA: Diagnosis not present

## 2017-08-14 DIAGNOSIS — M79672 Pain in left foot: Secondary | ICD-10-CM | POA: Diagnosis not present

## 2017-08-14 DIAGNOSIS — M779 Enthesopathy, unspecified: Secondary | ICD-10-CM

## 2017-08-14 MED ORDER — TRIAMCINOLONE ACETONIDE 10 MG/ML IJ SUSP
10.0000 mg | Freq: Once | INTRAMUSCULAR | Status: AC
  Administered 2017-08-14: 10 mg

## 2017-08-14 NOTE — Progress Notes (Signed)
Subjective:   Patient ID: Jeff Ware, male   DOB: 63 y.o.   MRN: [id number]   HPI Patient presents with lesions which have reoccurred and states that his feet overall are doing well but there is one spot has started to become tender again   ROS      Objective:  Physical Exam  Neurovascular status intact with inflammation around the fifth MPJ right with fluid buildup and deep keratotic lesion with lucent core     Assessment:  Inflammatory capsulitis fifth MPJ right with lesion formation     Plan:  Discussed surgery and reviewed x-rays with him at this point we will continue to try conservative and I injected the plantar capsule 3 mg dexamethasone 5 mg Xylocaine debrided lesion applied padding and advised that if it were to come back again quicker at one point surgical intervention may be necessary  Lesion is located underneath the fifth metatarsal right foot

## 2017-10-03 DIAGNOSIS — S60552A Superficial foreign body of left hand, initial encounter: Secondary | ICD-10-CM | POA: Diagnosis not present

## 2017-10-05 DIAGNOSIS — M795 Residual foreign body in soft tissue: Secondary | ICD-10-CM | POA: Diagnosis not present

## 2017-10-05 DIAGNOSIS — S61442A Puncture wound with foreign body of left hand, initial encounter: Secondary | ICD-10-CM | POA: Diagnosis not present

## 2017-10-23 ENCOUNTER — Ambulatory Visit (INDEPENDENT_AMBULATORY_CARE_PROVIDER_SITE_OTHER): Payer: BLUE CROSS/BLUE SHIELD | Admitting: *Deleted

## 2017-10-23 DIAGNOSIS — T63441D Toxic effect of venom of bees, accidental (unintentional), subsequent encounter: Secondary | ICD-10-CM

## 2017-10-24 DIAGNOSIS — T63441D Toxic effect of venom of bees, accidental (unintentional), subsequent encounter: Secondary | ICD-10-CM | POA: Diagnosis not present

## 2017-11-24 DIAGNOSIS — M549 Dorsalgia, unspecified: Secondary | ICD-10-CM | POA: Insufficient documentation

## 2017-11-24 DIAGNOSIS — M5126 Other intervertebral disc displacement, lumbar region: Secondary | ICD-10-CM | POA: Diagnosis not present

## 2017-11-24 DIAGNOSIS — M5136 Other intervertebral disc degeneration, lumbar region: Secondary | ICD-10-CM | POA: Diagnosis not present

## 2017-11-24 DIAGNOSIS — M545 Low back pain: Secondary | ICD-10-CM | POA: Diagnosis not present

## 2017-12-26 ENCOUNTER — Ambulatory Visit (INDEPENDENT_AMBULATORY_CARE_PROVIDER_SITE_OTHER): Payer: BLUE CROSS/BLUE SHIELD | Admitting: *Deleted

## 2017-12-26 DIAGNOSIS — T63441D Toxic effect of venom of bees, accidental (unintentional), subsequent encounter: Secondary | ICD-10-CM

## 2018-01-08 ENCOUNTER — Other Ambulatory Visit: Payer: Self-pay | Admitting: Family Medicine

## 2018-01-08 NOTE — Telephone Encounter (Signed)
Refill OK but make sure he has follow up office visit by August or Sept to repeat labs (CBC and PSA).

## 2018-01-08 NOTE — Telephone Encounter (Signed)
Last refill 07/14/17 and last office visit 02/13/18.  Okay to fill?

## 2018-02-14 ENCOUNTER — Ambulatory Visit (INDEPENDENT_AMBULATORY_CARE_PROVIDER_SITE_OTHER): Payer: BLUE CROSS/BLUE SHIELD | Admitting: Family Medicine

## 2018-02-14 VITALS — BP 124/68 | HR 50 | Temp 97.7°F | Wt 169.1 lb

## 2018-02-14 DIAGNOSIS — Z0000 Encounter for general adult medical examination without abnormal findings: Secondary | ICD-10-CM

## 2018-02-14 LAB — BASIC METABOLIC PANEL
BUN: 27 mg/dL — AB (ref 6–23)
CO2: 27 meq/L (ref 19–32)
Calcium: 9.6 mg/dL (ref 8.4–10.5)
Chloride: 107 mEq/L (ref 96–112)
Creatinine, Ser: 1.19 mg/dL (ref 0.40–1.50)
GFR: 65.74 mL/min (ref >60.00)
GLUCOSE: 82 mg/dL (ref 70–99)
POTASSIUM: 4.3 meq/L (ref 3.5–5.1)
SODIUM: 140 meq/L (ref 135–145)

## 2018-02-14 LAB — CBC WITH DIFFERENTIAL/PLATELET
BASOS ABS: 0 10*3/uL (ref 0.0–0.1)
Basophils Relative: 0.5 % (ref 0.0–3.0)
Eosinophils Absolute: 0.1 10*3/uL (ref 0.0–0.7)
Eosinophils Relative: 1.7 % (ref 0.0–5.0)
HCT: 41.6 % (ref 39.0–52.0)
HEMOGLOBIN: 14.3 g/dL (ref 13.0–17.0)
LYMPHS ABS: 1.2 10*3/uL (ref 0.7–4.0)
Lymphocytes Relative: 25.7 % (ref 12.0–46.0)
MCHC: 34.4 g/dL (ref 30.0–36.0)
MCV: 91.4 fl (ref 78.0–100.0)
MONO ABS: 0.4 10*3/uL (ref 0.1–1.0)
Monocytes Relative: 9.3 % (ref 3.0–12.0)
NEUTROS PCT: 62.8 % (ref 43.0–77.0)
Neutro Abs: 2.9 10*3/uL (ref 1.4–7.7)
Platelets: 216 10*3/uL (ref 150.0–400.0)
RBC: 4.55 Mil/uL (ref 4.22–5.81)
RDW: 14.3 % (ref 11.5–15.5)
WBC: 4.6 10*3/uL (ref 4.0–10.5)

## 2018-02-14 LAB — HEPATIC FUNCTION PANEL
ALT: 19 U/L (ref 0–53)
AST: 25 U/L (ref 0–37)
Albumin: 4.3 g/dL (ref 3.5–5.2)
Alkaline Phosphatase: 63 U/L (ref 39–117)
BILIRUBIN TOTAL: 0.6 mg/dL (ref 0.2–1.2)
Bilirubin, Direct: 0.1 mg/dL (ref 0.0–0.3)
Total Protein: 7 g/dL (ref 6.0–8.3)

## 2018-02-14 LAB — PSA: PSA: 1.38 ng/mL (ref 0.10–4.00)

## 2018-02-14 LAB — LIPID PANEL
Cholesterol: 184 mg/dL (ref 0–200)
HDL: 50.7 mg/dL (ref >39.00)
LDL CALC: 117 mg/dL — AB (ref 0–99)
NONHDL: 133.01
Total CHOL/HDL Ratio: 4
Triglycerides: 79 mg/dL (ref 0.0–149.0)
VLDL: 15.8 mg/dL (ref 0.0–40.0)

## 2018-02-14 LAB — TSH: TSH: 1.3 u[IU]/mL (ref 0.35–4.50)

## 2018-02-14 LAB — TESTOSTERONE: TESTOSTERONE: 946.11 ng/dL — AB (ref 300.00–890.00)

## 2018-02-14 NOTE — Progress Notes (Signed)
  Subjective:     Patient ID: Jeff Ware, male   DOB: [birth year], 62 y.o.   MRN: [id number]  HPI Patient seen for physical exam. He has remote history of testicular cancer and had orchiectomy. Has been on testosterone replacement since then. Nonsmoker. Very health conscious. Exercises regularly. Does combination of running, cycling, weights/resistance  Had colonoscopy last fall. Tetanus up-to-date. Had Zostavax back in 2016  Past Medical History:  Diagnosis Date  . Hyperlipidemia   . Testicular cancer [facility])    Past Surgical History:  Procedure Laterality Date  . COLONOSCOPY    . KNEE SURGERY    . orchiectomy left  2006  . POLYPECTOMY    . ROTATOR CUFF REPAIR      reports that he has never smoked. He has never used smokeless tobacco. He reports that he does not drink alcohol or use drugs. family history includes Heart disease in his father; Seizures in his father. Allergies  Allergen Reactions  . Bee Venom Other (See Comments)    bp drops and starts sweating      Review of Systems     Objective:   Physical Exam  Constitutional: He is oriented to person, place, and time. He appears well-developed and well-nourished. No distress.  HENT:  Head: Normocephalic and atraumatic.  Right Ear: External ear normal.  Left Ear: External ear normal.  Mouth/Throat: Oropharynx is clear and moist.  Eyes: Pupils are equal, round, and reactive to light. Conjunctivae and EOM are normal.  Neck: Normal range of motion. Neck supple. No thyromegaly present.  Cardiovascular: Normal rate, regular rhythm and normal heart sounds.  No murmur heard. Pulmonary/Chest: No respiratory distress. He has no wheezes. He has no rales.  Abdominal: Soft. Bowel sounds are normal. He exhibits no distension and no mass. There is no tenderness. There is no rebound and no guarding.  Musculoskeletal: He exhibits no edema.  Lymphadenopathy:    He has no cervical adenopathy.  Neurological: He is alert and  oriented to person, place, and time. He displays normal reflexes. No cranial nerve deficit.  Skin: No rash noted.  He has a few scattered seborrheic keratoses on his trunk. No concerning lesions. He sees dermatologist regularly  Psychiatric: He has a normal mood and affect.       Assessment:     Physical exam. Generally healthy 62 year old male. Remote history of testicle cancer. Low testosterone related to previous orchiectomy    Plan:     -Obtain lab work. Includiing total testosterone level and PSA. -reminder for flu vaccine this fall  Eulas Post MD [facility] at [facility]

## 2018-03-27 ENCOUNTER — Ambulatory Visit (INDEPENDENT_AMBULATORY_CARE_PROVIDER_SITE_OTHER): Payer: BLUE CROSS/BLUE SHIELD | Admitting: *Deleted

## 2018-03-27 DIAGNOSIS — T63441D Toxic effect of venom of bees, accidental (unintentional), subsequent encounter: Secondary | ICD-10-CM

## 2018-04-17 ENCOUNTER — Telehealth: Payer: Self-pay

## 2018-04-17 MED ORDER — TESTOSTERONE 12.5 MG/ACT (1%) TD GEL: TRANSDERMAL | 0 refills | Status: DC

## 2018-04-17 NOTE — Telephone Encounter (Signed)
Last OV 02/14/18 (CPE), Next OV 02/20/2019  Testosterone 12.5 mg 1% gel last filled 01/09/2018, 75 bottle with 1 refill.  [facility] is asking for a refill. Please advise if okay to refill. Thank you!

## 2018-04-17 NOTE — Telephone Encounter (Signed)
Ok to refill 

## 2018-04-18 NOTE — Telephone Encounter (Signed)
[place] and spoke to [place] and refilled testosterone for patient per [facility].

## 2018-05-08 ENCOUNTER — Other Ambulatory Visit: Payer: Self-pay | Admitting: Dermatology

## 2018-05-08 DIAGNOSIS — D485 Neoplasm of uncertain behavior of skin: Secondary | ICD-10-CM | POA: Diagnosis not present

## 2018-05-08 DIAGNOSIS — L570 Actinic keratosis: Secondary | ICD-10-CM | POA: Diagnosis not present

## 2018-05-08 DIAGNOSIS — D0421 Carcinoma in situ of skin of right ear and external auricular canal: Secondary | ICD-10-CM | POA: Diagnosis not present

## 2018-05-08 DIAGNOSIS — D229 Melanocytic nevi, unspecified: Secondary | ICD-10-CM | POA: Diagnosis not present

## 2018-05-08 DIAGNOSIS — H00025 Hordeolum internum left lower eyelid: Secondary | ICD-10-CM | POA: Diagnosis not present

## 2018-05-22 ENCOUNTER — Ambulatory Visit: Payer: BLUE CROSS/BLUE SHIELD

## 2018-06-06 ENCOUNTER — Ambulatory Visit (INDEPENDENT_AMBULATORY_CARE_PROVIDER_SITE_OTHER): Payer: BLUE CROSS/BLUE SHIELD | Admitting: Podiatry

## 2018-06-06 ENCOUNTER — Encounter: Payer: Self-pay | Admitting: Podiatry

## 2018-06-06 DIAGNOSIS — M779 Enthesopathy, unspecified: Secondary | ICD-10-CM

## 2018-06-06 DIAGNOSIS — D0421 Carcinoma in situ of skin of right ear and external auricular canal: Secondary | ICD-10-CM | POA: Diagnosis not present

## 2018-06-06 MED ORDER — TRIAMCINOLONE ACETONIDE 10 MG/ML IJ SUSP
10.0000 mg | Freq: Once | INTRAMUSCULAR | Status: AC
  Administered 2018-06-06: 10 mg

## 2018-06-07 NOTE — Progress Notes (Signed)
Subjective:   Patient ID: Jeff Ware, male   DOB: 62 y.o.   MRN: [id number]   HPI Patient presents stating this lesion has come back but I did get around 9 months of relief and fluid has filled up again underneath the bone   ROS      Objective:  Physical Exam  Neurovascular status intact with inflammation of the sub-fifth metatarsal right with keratotic lesion with pain to palpation     Assessment:  Inflammatory capsulitis fifth MPJ right with pain     Plan:  Discussed ultimate surgery but today I did a sterile prep and then injected the capsule fifth MPJ 3 mg Kenalog 5 mg Xylocaine and went ahead debrided lesion and reappoint as needed

## 2018-06-08 ENCOUNTER — Other Ambulatory Visit: Payer: Self-pay | Admitting: Family Medicine

## 2018-06-08 ENCOUNTER — Ambulatory Visit (INDEPENDENT_AMBULATORY_CARE_PROVIDER_SITE_OTHER): Payer: BLUE CROSS/BLUE SHIELD | Admitting: *Deleted

## 2018-06-08 DIAGNOSIS — T63441D Toxic effect of venom of bees, accidental (unintentional), subsequent encounter: Secondary | ICD-10-CM

## 2018-06-08 NOTE — Telephone Encounter (Signed)
Requested medication (s) are due for refill today: yes  Requested medication (s) are on the active medication list: yes  Last refill:  04/17/18  Future visit scheduled: 02/20/19  Notes to clinic:     Requested Prescriptions  Pending Prescriptions Disp Refills   Testosterone 12.5 MG/ACT (1%) GEL 75 Bottle 0    Sig: Apply 1 mg topically once a day as directed     Off-Protocol Failed - 06/08/2018 10:24 AM      Failed - Medication not assigned to a protocol, review manually.      Passed - Valid encounter within last 12 months    Recent Outpatient Visits          3 months ago Physical exam   [facility]rap[facility], [facility] at [facility], Alinda Sierras, MD   1 year ago Physical exam   [place] at [facility], Alinda Sierras, MD   2 years ago Physical exam   [place] at [facility], Alinda Sierras, MD   3 years ago Physical exam   [place] at [facility], Alinda Sierras, MD   4 years ago Physical exam, annual   Therapist, music at [place], MD      Future Appointments            In 8 months Bur[place], Alinda Sierras, MD [place] at [place], [facility]

## 2018-06-08 NOTE — Telephone Encounter (Signed)
Copied from [place] [id number]. Topic: Quick Communication - Rx Refill/Question >> Jun 08, 2018 10:13 AM Bea Graff, NT wrote: Medication: Testosterone 12.5 MG/ACT (1%) GEL   Has the patient contacted their pharmacy? Yes.   (Agent: If no, request that the patient contact the pharmacy for the refill.) (Agent: If yes, when and what did the pharmacy advise?) Stated they sent the request on Monday. Pt has been out for a week.  Preferred Pharmacy (with phone number or street name): [place], [place] [phone] (Phone) [phone] (Fax)    Agent: Please be advised that RX refills may take up to 3 business days. We ask that you follow-up with your pharmacy.

## 2018-06-11 ENCOUNTER — Telehealth: Payer: Self-pay | Admitting: Family Medicine

## 2018-06-11 MED ORDER — TESTOSTERONE 12.5 MG/ACT (1%) TD GEL: TRANSDERMAL | 0 refills | Status: DC

## 2018-06-11 NOTE — Telephone Encounter (Signed)
Signed this request on 06-11-18

## 2018-06-11 NOTE — Telephone Encounter (Signed)
Requested medication (s) are due for refill today: Yes  Requested medication (s) are on the active medication list: Yes  Last refill:  04/17/18  Future visit scheduled: Yes  Notes to clinic:  Unable to refill per protocol     Requested Prescriptions  Pending Prescriptions Disp Refills   Testosterone 12.5 MG/ACT (1%) GEL 75 Bottle 0    Sig: Apply 1 mg topically once a day as directed     Off-Protocol Failed - 06/11/2018 10:38 AM      Failed - Medication not assigned to a protocol, review manually.      Passed - Valid encounter within last 12 months    Recent Outpatient Visits          3 months ago Physical exam   Therapist, music at [facility], Alinda Sierras, MD   1 year ago Physical exam   [place] at [facility], Alinda Sierras, MD   2 years ago Physical exam   [place] at [facility], Alinda Sierras, MD   3 years ago Physical exam   [place] at [facility], Alinda Sierras, MD   4 years ago Physical exam, annual   Therapist, music at [place], MD      Future Appointments            In 8 months Burchette, Alinda Sierras, MD [place] at [place], [facility]

## 2018-06-11 NOTE — Telephone Encounter (Signed)
Last OV 02/14/18, Next OV 02/20/2019  Last filled 04/17/18, 75 g (bottle) with 0 refills

## 2018-06-11 NOTE — Telephone Encounter (Signed)
Copied from [place] [id number]. Topic: Quick Communication - Rx Refill/Question >> Jun 08, 2018 10:13 AM Bea Graff, NT wrote: Medication: Testosterone 12.5 MG/ACT (1%) GEL   Patient called back to say that he has been out for few weeks now.   Has the patient contacted their pharmacy? Yes.   (Agent: If no, request that the patient contact the pharmacy for the refill.) (Agent: If yes, when and what did the pharmacy advise?)  Preferred Pharmacy (with phone number or street name): [place], [place] [phone] (Phone) [phone] (Fax)    Agent: Please be advised that RX refills may take up to 3 business days. We ask that you follow-up with your pharmacy.

## 2018-06-12 ENCOUNTER — Ambulatory Visit: Payer: Self-pay | Admitting: *Deleted

## 2018-06-12 MED ORDER — TESTOSTERONE 12.5 MG/ACT (1%) TD GEL: TRANSDERMAL | 0 refills | Status: DC

## 2018-06-12 NOTE — Telephone Encounter (Signed)
Prescription has been faxed to [facility] for the patient. Previously faxed to [facility]  Called patient and let him know that this will be faxed to preferred pharmacy. Patient verbalized an understanding.

## 2018-06-12 NOTE — Telephone Encounter (Signed)
Pt following up on this request as the Rx  Testosterone 12.5 MG/ACT (1%) GEL needs to go to  [place], [place] [phone] (Phone) [phone] (Fax)   Pt states he has been trying several days to get this filled

## 2018-06-12 NOTE — Telephone Encounter (Signed)
See 06/11/18 telephone encounter.  Message from Sheran Luz sent at 06/12/2018 4:41 PM EST   Summary: Pharmacist requesting clarification-Attempted warm transfer to triage-   Gwinda Passe, from [place], requesting a call back for clarification on patients medication Testosterone 12.5 MG/ACT (1%) GEL.

## 2018-06-12 NOTE — Telephone Encounter (Signed)
Spoke with Ander Purpura, pharmacist at [facility] who states that a prescription for Testosterone (1%) gel was received today. Lauren states that instructions are written for the pt to apply 1 mg which would actually be 0.01%. Lauren states that the pt previously requested refills on Testosterone 5% which would be 50 mg. Please call pharmacy to further clarify dosage of Testosterone at [phone].

## 2018-06-13 NOTE — Telephone Encounter (Signed)
Please see message. Please advise if the medication on list is the correct dosage and correct instructions. Thank you!

## 2018-06-13 NOTE — Telephone Encounter (Signed)
He should be on the same dose of testosterone that he has been taking all along.   We would request refill of [facility].

## 2018-06-13 NOTE — Telephone Encounter (Signed)
[place] and spoke to pharmacist Ed and he stated that the patient has been on the 5% testosterone the entire time since 04/2012. OK to approve per Dr. Elease Hashimoto.

## 2018-07-20 ENCOUNTER — Telehealth: Payer: Self-pay | Admitting: Family Medicine

## 2018-07-20 MED ORDER — TESTOSTERONE 12.5 MG/ACT (1%) TD GEL: TRANSDERMAL | 1 refills | Status: DC

## 2018-07-20 NOTE — Telephone Encounter (Signed)
Requested medication (s) are due for refill today: unsure  Requested medication (s) are on the active medication list: yes    Last refill: 06/12/18  75/bottle  Future visit scheduled yes 02/20/2019  Dr. Elease Hashimoto  Notes to clinic:not delegated  Requested Prescriptions  Pending Prescriptions Disp Refills   Testosterone 12.5 MG/ACT (1%) GEL 75 Bottle 0    Sig: Apply 1 mg topically once a day as directed     Off-Protocol Failed - 07/20/2018 11:20 AM      Failed - Medication not assigned to a protocol, review manually.      Passed - Valid encounter within last 12 months    Recent Outpatient Visits          5 months ago Physical exam   [facility]rap[facility], [facility] at [facility], Alinda Sierras, MD   1 year ago Physical exam   [place] at [facility], Alinda Sierras, MD   2 years ago Physical exam   [place] at [facility], Alinda Sierras, MD   3 years ago Physical exam   [place] at [facility], Alinda Sierras, MD   4 years ago Physical exam, annual   Therapist, music at [place], MD      Future Appointments            In 7 months Burchette, Alinda Sierras, MD [place] at [place], [facility]

## 2018-07-20 NOTE — Telephone Encounter (Signed)
Copied from [place] [id number]. Topic: Quick Communication - Rx Refill/Question >> Jul 20, 2018 11:15 AM Leward Quan A wrote: Medication: Testosterone 12.5 MG/ACT (1%) GEL   Has the patient contacted their pharmacy? Yes.   (Agent: If no, request that the patient contact the pharmacy for the refill.) (Agent: If yes, when and what did the pharmacy advise?)  Preferred Pharmacy (with phone number or street name): [place], [place] [phone] (Phone) [phone] (Fax)    Agent: Please be advised that RX refills may take up to 3 business days. We ask that you follow-up with your pharmacy.

## 2018-07-23 NOTE — Telephone Encounter (Signed)
Prescription has been faxed to the [facility].

## 2018-07-23 NOTE — Telephone Encounter (Signed)
Patient calling and states that [facility] needs approval for this the Testosterone 12.5 MG/ACT (1%) GEL Please advise.  CB#: [phone]

## 2018-07-24 NOTE — Telephone Encounter (Signed)
Called Betsy from [facility] and gave her the verbal to continue the same prescription that they have been filling. I asked Gwinda Passe to send me an escribe prescription for the current medication and she stated she would so I can delete the current Rx and save the new one.

## 2018-07-24 NOTE — Telephone Encounter (Signed)
Inez Catalina from [facility] calling.  States they have questions on this script because what was sent is not what should be compounded but what is commercially available.

## 2018-08-09 ENCOUNTER — Ambulatory Visit (INDEPENDENT_AMBULATORY_CARE_PROVIDER_SITE_OTHER): Payer: BLUE CROSS/BLUE SHIELD

## 2018-08-09 DIAGNOSIS — T63441D Toxic effect of venom of bees, accidental (unintentional), subsequent encounter: Secondary | ICD-10-CM | POA: Diagnosis not present

## 2018-08-16 ENCOUNTER — Ambulatory Visit: Payer: BLUE CROSS/BLUE SHIELD | Admitting: Allergy

## 2018-09-05 DIAGNOSIS — B070 Plantar wart: Secondary | ICD-10-CM | POA: Diagnosis not present

## 2018-09-05 DIAGNOSIS — L570 Actinic keratosis: Secondary | ICD-10-CM | POA: Diagnosis not present

## 2018-10-04 ENCOUNTER — Ambulatory Visit: Payer: Self-pay

## 2018-10-09 ENCOUNTER — Ambulatory Visit (INDEPENDENT_AMBULATORY_CARE_PROVIDER_SITE_OTHER): Payer: BLUE CROSS/BLUE SHIELD | Admitting: Allergy and Immunology

## 2018-10-09 ENCOUNTER — Encounter: Payer: Self-pay | Admitting: Allergy and Immunology

## 2018-10-09 ENCOUNTER — Other Ambulatory Visit: Payer: Self-pay

## 2018-10-09 ENCOUNTER — Ambulatory Visit: Payer: Self-pay

## 2018-10-09 VITALS — BP 110/74 | HR 56 | Temp 98.1°F | Resp 16 | Ht 69.0 in | Wt 166.4 lb

## 2018-10-09 DIAGNOSIS — T63441D Toxic effect of venom of bees, accidental (unintentional), subsequent encounter: Secondary | ICD-10-CM

## 2018-10-09 MED ORDER — EPINEPHRINE 0.3 MG/0.3ML IJ SOAJ: 0.3000 mg | Freq: Once | INTRAMUSCULAR | 2 refills | Status: AC

## 2018-10-09 NOTE — Progress Notes (Signed)
[place] [facility] [place] [facility] [place]   Follow-up Note  Referring Provider: Eulas Post, MD Primary Provider: Eulas Post, MD Date of Office Visit: 10/09/2018  Subjective:   Jeff Ware (DOB: [birth year]) is a 63 y.o. 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  His last visit in this clinic was over 2 years ago.  He is currently receiving immunotherapy directed against mixed vespid and wasp every 8 weeks without any adverse effect.  He has had several stings in the field without any adverse effect.  He does have an injectable epinephrine device.  Allergies as of 10/09/2018      Reactions   Bee Venom Other (See Comments)   bp drops and starts sweating      Medication List      aspirin 81 MG tablet Take 81 mg by mouth daily.   EpiPen 2-Pak 0.3 mg/0.3 mL Soaj injection Generic drug:  EPINEPHrine Inject into the muscle once.   fish oil-omega-3 fatty acids 1000 MG capsule Take 2 g by mouth 2 (two) times daily.   fluorouracil 5 % cream Commonly known as:  EFUDEX   multivitamin per tablet Take 1 tablet by mouth daily.   Red Yeast Rice 600 MG Tabs Take 600 mg by mouth daily.   Testosterone 12.5 MG/ACT (1%) Gel Apply 1 mg topically once a day as directed       Past Medical History:  Diagnosis Date  . Atypical nevi 09/23/2010   right low back (moderate)  . Hyperlipidemia   . Squamous cell carcinoma in situ (SCCIS) 03/26/2014   scc in situ Right collar bone  . Squamous cell carcinoma in situ (SCCIS) 05/08/2018   Right ear rim  . Testicular cancer [facility])     Past Surgical History:  Procedure Laterality Date  . COLONOSCOPY    . KNEE SURGERY    . orchiectomy left  2006  . POLYPECTOMY    . ROTATOR CUFF REPAIR      Review of systems negative  except as noted in HPI / PMHx or noted below:  Review of Systems  Constitutional: Negative.   HENT: Negative.   Eyes: Negative.   Respiratory: Negative.   Cardiovascular: Negative.   Gastrointestinal: Negative.   Genitourinary: Negative.   Musculoskeletal: Negative.   Skin: Negative.   Neurological: Negative.   Endo/Heme/Allergies: Negative.   Psychiatric/Behavioral: Negative.      Objective:   Vitals:   10/09/18 1142  BP: 110/74  Pulse: (!) 56  Resp: 16  Temp: 98.1 F (36.7 C)  SpO2: 97%   Height: 5\' 9"  (175.3 cm)  Weight: 166 lb 6.4 oz (75.5 kg)   Physical Exam Constitutional:      Appearance: He is not diaphoretic.  HENT:     Head: Normocephalic.     Right Ear: Tympanic membrane, ear canal and external ear normal.     Left Ear: Tympanic membrane, ear canal and external ear normal.     Nose: Nose normal. No mucosal edema or rhinorrhea.     Mouth/Throat:     Pharynx: Uvula midline. No oropharyngeal exudate.  Eyes:     Conjunctiva/sclera: Conjunctivae normal.  Neck:     Thyroid: No thyromegaly.     Trachea: Trachea normal. No tracheal tenderness or tracheal deviation.  Cardiovascular:  Rate and Rhythm: Normal rate and regular rhythm.     Heart sounds: Normal heart sounds, S1 normal and S2 normal. No murmur.  Pulmonary:     Effort: No respiratory distress.     Breath sounds: Normal breath sounds. No stridor. No wheezing or rales.  Lymphadenopathy:     Head:     Right side of head: No tonsillar adenopathy.     Left side of head: No tonsillar adenopathy.     Cervical: No cervical adenopathy.  Skin:    Findings: No erythema or rash.     Nails: There is no clubbing.   Neurological:     Mental Status: He is alert.     Diagnostics: none  Assessment and Plan:   1. Toxic effect of venom of bees, unintentional, subsequent encounter     1.  Continue immunotherapy  2.  Auvi-Q, Benadryl, MD/ER evaluation for allergic reaction  3.  Return to clinic  in 1 year or earlier if problem  Jeff Ware will continue to use immunotherapy 6 times a year.  Given his initial reaction of hypotension he should just continue on immunotherapy for life.  We will see him back in this clinic in 1 year or earlier if there is a problem.  Allena Katz, MD Allergy / Immunology [place]

## 2018-10-09 NOTE — Patient Instructions (Addendum)
  1.  Continue immunotherapy  2.  Auvi-Q, Benadryl, MD/ER evaluation for allergic reaction  3.  Return to clinic in 1 year or earlier if problem

## 2018-10-10 ENCOUNTER — Encounter: Payer: Self-pay | Admitting: Allergy and Immunology

## 2018-10-16 ENCOUNTER — Other Ambulatory Visit: Payer: Self-pay | Admitting: Family Medicine

## 2018-10-16 MED ORDER — TESTOSTERONE 12.5 MG/ACT (1%) TD GEL: TRANSDERMAL | 0 refills | Status: DC

## 2018-10-16 NOTE — Telephone Encounter (Signed)
Refill OK

## 2018-10-16 NOTE — Telephone Encounter (Signed)
Requested medication (s) are due for refill today:yes  Requested medication (s) are on the active medication list: yes  Last refill: 06/11/18  Future visit scheduled:yes  Notes to clinic: Off protocol    Requested Prescriptions  Pending Prescriptions Disp Refills   Testosterone 12.5 MG/ACT (1%) GEL 75 Bottle 0    Sig: Apply 1 mg topically once a day as directed     Off-Protocol Failed - 10/16/2018 10:30 AM      Failed - Medication not assigned to a protocol, review manually.      Passed - Valid encounter within last 12 months    Recent Outpatient Visits          8 months ago Physical exam   Therapist, music at [facility], Alinda Sierras, MD   1 year ago Physical exam   [place] at [facility], Alinda Sierras, MD   2 years ago Physical exam   [place] at [facility], Alinda Sierras, MD   3 years ago Physical exam   [place] at [facility], Alinda Sierras, MD   4 years ago Physical exam, annual   Therapist, music at [place], MD      Future Appointments            In 4 months Burchette, Alinda Sierras, MD [place] at [place], [facility]

## 2018-10-16 NOTE — Telephone Encounter (Signed)
Last OV 02/14/18, Next OV 02/20/19  Last filled 06/11/18, 75 Bottle with 0 refills

## 2018-10-16 NOTE — Telephone Encounter (Signed)
Please send through your system. This is controlled. Thank you

## 2018-10-18 ENCOUNTER — Telehealth: Payer: Self-pay | Admitting: Family Medicine

## 2018-10-18 NOTE — Telephone Encounter (Signed)
Patient called and stated that [place] has not gotten his testosterone RX and would like to have it sent again and a call to him once sent. Call back is [phone] (home)

## 2018-10-19 NOTE — Telephone Encounter (Signed)
[place] and gave the refill to [facility] and she will refill for patient.  Called patient to let him know that I have called his refill in to [facility] and he thanked me.

## 2018-12-04 ENCOUNTER — Ambulatory Visit: Payer: BLUE CROSS/BLUE SHIELD

## 2018-12-05 ENCOUNTER — Ambulatory Visit (INDEPENDENT_AMBULATORY_CARE_PROVIDER_SITE_OTHER): Payer: BC Managed Care – PPO

## 2018-12-05 DIAGNOSIS — T63441D Toxic effect of venom of bees, accidental (unintentional), subsequent encounter: Secondary | ICD-10-CM

## 2018-12-06 ENCOUNTER — Telehealth: Payer: Self-pay | Admitting: Family Medicine

## 2018-12-06 NOTE — Telephone Encounter (Signed)
Copied from [place] [id number]. Topic: Quick Communication - Rx Refill/Question >> Dec 06, 2018  1:57 PM Erick Blinks wrote: Medication: Testosterone 12.5 MG/ACT (1%) GEL [[id number]] - PA needed. Please advise   Has the patient contacted their pharmacy? Yes  (Agent: If no, request that the patient contact the pharmacy for the refill.) (Agent: If yes, when and what did the pharmacy advise?)  Preferred Pharmacy (with phone number or street name): [place], [place] [address] [place] [address] Phone: [phone] Fax: [phone]    Agent: Please be advised that RX refills may take up to 3 business days. We ask that you follow-up with your pharmacy.

## 2018-12-11 ENCOUNTER — Other Ambulatory Visit: Payer: Self-pay | Admitting: *Deleted

## 2018-12-11 MED ORDER — TESTOSTERONE 12.5 MG/ACT (1%) TD GEL: TRANSDERMAL | 1 refills | Status: DC

## 2018-12-11 NOTE — Telephone Encounter (Signed)
Copied from [place] [phone]. Topic: General - Other >> Dec 11, 2018  1:58 PM Rainey Pines A wrote: Patient contacted office and stated that his pharmacy still needs to the prescription for testosterone signed off on and sent to to pharmacy. [place], [place] [phone] (Phone) [phone] (Fax

## 2018-12-11 NOTE — Telephone Encounter (Signed)
Last fill 10/16/2018 Last OV 02/14/18  Ok to fill?

## 2018-12-11 NOTE — Telephone Encounter (Signed)
Refill OK.  He will need follow up by August for labs.

## 2018-12-12 NOTE — Telephone Encounter (Signed)
Talk with Pharmacy. Rx received.

## 2018-12-12 NOTE — Telephone Encounter (Signed)
I sent this in yesterday.  May need to call pharmacy to confirm that they received this.  For some reason, we have had difficulty with this going through previously.

## 2019-01-30 ENCOUNTER — Other Ambulatory Visit: Payer: Self-pay

## 2019-01-30 ENCOUNTER — Ambulatory Visit (INDEPENDENT_AMBULATORY_CARE_PROVIDER_SITE_OTHER): Payer: BC Managed Care – PPO

## 2019-01-30 DIAGNOSIS — T63441D Toxic effect of venom of bees, accidental (unintentional), subsequent encounter: Secondary | ICD-10-CM

## 2019-02-20 ENCOUNTER — Encounter: Payer: BLUE CROSS/BLUE SHIELD | Admitting: Family Medicine

## 2019-02-25 ENCOUNTER — Other Ambulatory Visit: Payer: Self-pay

## 2019-02-25 ENCOUNTER — Ambulatory Visit (INDEPENDENT_AMBULATORY_CARE_PROVIDER_SITE_OTHER): Payer: BC Managed Care – PPO | Admitting: Family Medicine

## 2019-02-25 ENCOUNTER — Encounter: Payer: Self-pay | Admitting: Family Medicine

## 2019-02-25 VITALS — BP 108/62 | HR 49 | Temp 98.1°F | Ht 67.5 in | Wt 164.4 lb

## 2019-02-25 DIAGNOSIS — Z125 Encounter for screening for malignant neoplasm of prostate: Secondary | ICD-10-CM

## 2019-02-25 DIAGNOSIS — Z0000 Encounter for general adult medical examination without abnormal findings: Secondary | ICD-10-CM

## 2019-02-25 LAB — LIPID PANEL
Cholesterol: 205 mg/dL — ABNORMAL HIGH (ref 0–200)
HDL: 49.5 mg/dL (ref >39.00)
LDL Cholesterol: 143 mg/dL — ABNORMAL HIGH (ref 0–99)
NonHDL: 155.84
Total CHOL/HDL Ratio: 4
Triglycerides: 63 mg/dL (ref 0.0–149.0)
VLDL: 12.6 mg/dL (ref 0.0–40.0)

## 2019-02-25 LAB — BASIC METABOLIC PANEL
BUN: 18 mg/dL (ref 6–23)
CO2: 26 mEq/L (ref 19–32)
Calcium: 9.3 mg/dL (ref 8.4–10.5)
Chloride: 107 mEq/L (ref 96–112)
Creatinine, Ser: 0.99 mg/dL (ref 0.40–1.50)
GFR: 76.23 mL/min (ref >60.00)
Glucose, Bld: 86 mg/dL (ref 70–99)
Potassium: 4 mEq/L (ref 3.5–5.1)
Sodium: 140 mEq/L (ref 135–145)

## 2019-02-25 LAB — CBC WITH DIFFERENTIAL/PLATELET
Basophils Absolute: 0 10*3/uL (ref 0.0–0.1)
Basophils Relative: 0.4 % (ref 0.0–3.0)
Eosinophils Absolute: 0 10*3/uL (ref 0.0–0.7)
Eosinophils Relative: 0.6 % (ref 0.0–5.0)
HCT: 41.7 % (ref 39.0–52.0)
Hemoglobin: 14.2 g/dL (ref 13.0–17.0)
Lymphocytes Relative: 20.7 % (ref 12.0–46.0)
Lymphs Abs: 1.3 10*3/uL (ref 0.7–4.0)
MCHC: 34 g/dL (ref 30.0–36.0)
MCV: 92.5 fl (ref 78.0–100.0)
Monocytes Absolute: 0.5 10*3/uL (ref 0.1–1.0)
Monocytes Relative: 8.2 % (ref 3.0–12.0)
Neutro Abs: 4.4 10*3/uL (ref 1.4–7.7)
Neutrophils Relative %: 70.1 % (ref 43.0–77.0)
Platelets: 207 10*3/uL (ref 150.0–400.0)
RBC: 4.51 Mil/uL (ref 4.22–5.81)
RDW: 14.3 % (ref 11.5–15.5)
WBC: 6.2 10*3/uL (ref 4.0–10.5)

## 2019-02-25 LAB — HEPATIC FUNCTION PANEL
ALT: 17 U/L (ref 0–53)
AST: 23 U/L (ref 0–37)
Albumin: 4.4 g/dL (ref 3.5–5.2)
Alkaline Phosphatase: 65 U/L (ref 39–117)
Bilirubin, Direct: 0.1 mg/dL (ref 0.0–0.3)
Total Bilirubin: 0.8 mg/dL (ref 0.2–1.2)
Total Protein: 6.7 g/dL (ref 6.0–8.3)

## 2019-02-25 MED ORDER — TESTOSTERONE 12.5 MG/ACT (1%) TD GEL: TRANSDERMAL | 5 refills | Status: DC

## 2019-02-25 NOTE — Progress Notes (Signed)
Subjective:     Patient ID: Jeff Ware, male   DOB: [birth year], 63 y.o.   MRN: [id number]  HPI [place] Ormond is here for complete physical.  He has history of testicular cancer several years ago.  He has hypogonadism and is on testosterone replacement.  He has had some mild hyperlipidemia.  Generally very healthy.  He is in the process of retiring as a Hydrologist.  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  Still exercises very regularly.  Previous hepatitis C screening negative.  Colonoscopy due 2028.  Tetanus due 2023.  Plans to get flu vaccine later. He has had previous Zostavax 2016.  Past Medical History:  Diagnosis Date  . Atypical nevi 09/23/2010   right low back (moderate)  . Hyperlipidemia   . Squamous cell carcinoma in situ (SCCIS) 03/26/2014   scc in situ Right collar bone  . Squamous cell carcinoma in situ (SCCIS) 05/08/2018   Right ear rim  . Testicular cancer [facility])    Past Surgical History:  Procedure Laterality Date  . COLONOSCOPY    . KNEE SURGERY    . orchiectomy left  2006  . POLYPECTOMY    . ROTATOR CUFF REPAIR      reports that he has never smoked. He has never used smokeless tobacco. He reports that he does not drink alcohol or use drugs. family history includes Heart disease in his father; Seizures in his father. Allergies  Allergen Reactions  . Bee Venom Other (See Comments)    bp drops and starts sweating      Review of Systems  Constitutional: Negative for activity change, appetite change, fatigue and fever.  HENT: Negative for congestion, ear pain and trouble swallowing.   Eyes: Negative for pain and visual disturbance.  Respiratory: Negative for cough, shortness of breath and wheezing.   Cardiovascular: Negative for chest pain and palpitations.  Gastrointestinal: Negative for abdominal distention, abdominal pain, blood in stool, constipation, diarrhea, nausea, rectal pain and vomiting.  Genitourinary: Negative  for dysuria, hematuria and testicular pain.  Musculoskeletal: Negative for arthralgias and joint swelling.  Skin: Negative for rash.  Neurological: Negative for dizziness, syncope and headaches.  Hematological: Negative for adenopathy.  Psychiatric/Behavioral: Negative for confusion and dysphoric mood.       Objective:   Physical Exam Constitutional:      Appearance: Normal appearance.  HENT:     Right Ear: Tympanic membrane normal.     Left Ear: Tympanic membrane normal.  Neck:     Musculoskeletal: Neck supple.  Cardiovascular:     Rate and Rhythm: Normal rate and regular rhythm.     Heart sounds: No murmur. No gallop.   Pulmonary:     Effort: Pulmonary effort is normal.     Breath sounds: Normal breath sounds. No wheezing or rales.  Abdominal:     General: There is no distension.     Palpations: Abdomen is soft. There is no mass.     Tenderness: There is no abdominal tenderness. There is no guarding or rebound.  Musculoskeletal:     Right lower leg: No edema.     Left lower leg: No edema.  Lymphadenopathy:     Cervical: No cervical adenopathy.  Skin:    Findings: No rash.  Neurological:     General: No focal deficit present.     Mental Status: He is alert and oriented to person, place, and time.     Cranial  Nerves: No cranial nerve deficit.        Assessment:     Physical exam.  He has remote history of testicle cancer many years ago.  Hypogonadism on testosterone replacement    Plan:     -Check labs.  Include CBC, PSA, testosterone levels-secondary to history of hypogonadism. -Refill testosterone -Continue regular exercise habits -Discussed newer shingles vaccine which he will consider possibly in a year or 2 -Recommend yearly flu vaccine  Eulas Post MD [place] [facility]

## 2019-02-26 ENCOUNTER — Telehealth: Payer: Self-pay

## 2019-02-26 ENCOUNTER — Telehealth: Payer: Self-pay | Admitting: Family Medicine

## 2019-02-26 LAB — PSA: PSA: 1.49 ng/mL (ref 0.10–4.00)

## 2019-02-26 LAB — TSH: TSH: 0.67 u[IU]/mL (ref 0.35–4.50)

## 2019-02-26 LAB — TESTOSTERONE: Testosterone: 580.49 ng/dL (ref 300.00–890.00)

## 2019-02-26 NOTE — Telephone Encounter (Signed)
[place] and spoke with [facility] and gave her the verbal for the Testosterone order plus 5 refills per Dr. Elease Hashimoto since the escribe order did not go through. Betsy verbalized an understanding and stated that they will call the patient when ready for pick up.

## 2019-02-26 NOTE — Telephone Encounter (Signed)
Patient returned call for lab results. Pt given lab results per notes of Dr Elease Hashimoto on 02/26/2019. Pt verbalized understanding.

## 2019-03-13 ENCOUNTER — Other Ambulatory Visit: Payer: Self-pay | Admitting: Family Medicine

## 2019-04-01 ENCOUNTER — Ambulatory Visit: Payer: Self-pay

## 2019-04-08 ENCOUNTER — Ambulatory Visit (INDEPENDENT_AMBULATORY_CARE_PROVIDER_SITE_OTHER): Payer: BC Managed Care – PPO | Admitting: *Deleted

## 2019-04-08 ENCOUNTER — Other Ambulatory Visit: Payer: Self-pay

## 2019-04-08 DIAGNOSIS — T63441D Toxic effect of venom of bees, accidental (unintentional), subsequent encounter: Secondary | ICD-10-CM

## 2019-06-03 ENCOUNTER — Ambulatory Visit (INDEPENDENT_AMBULATORY_CARE_PROVIDER_SITE_OTHER): Payer: BC Managed Care – PPO | Admitting: *Deleted

## 2019-06-03 ENCOUNTER — Other Ambulatory Visit: Payer: Self-pay

## 2019-06-03 DIAGNOSIS — T63441D Toxic effect of venom of bees, accidental (unintentional), subsequent encounter: Secondary | ICD-10-CM

## 2019-07-29 ENCOUNTER — Ambulatory Visit: Payer: Self-pay

## 2019-08-02 ENCOUNTER — Other Ambulatory Visit: Payer: Self-pay

## 2019-08-02 ENCOUNTER — Ambulatory Visit (INDEPENDENT_AMBULATORY_CARE_PROVIDER_SITE_OTHER): Payer: BC Managed Care – PPO | Admitting: *Deleted

## 2019-08-02 DIAGNOSIS — T63441D Toxic effect of venom of bees, accidental (unintentional), subsequent encounter: Secondary | ICD-10-CM

## 2019-08-12 ENCOUNTER — Encounter: Payer: Self-pay | Admitting: Podiatry

## 2019-08-12 ENCOUNTER — Other Ambulatory Visit: Payer: Self-pay

## 2019-08-12 ENCOUNTER — Ambulatory Visit (INDEPENDENT_AMBULATORY_CARE_PROVIDER_SITE_OTHER): Payer: BC Managed Care – PPO | Admitting: Podiatry

## 2019-08-12 ENCOUNTER — Telehealth: Payer: Self-pay | Admitting: Family Medicine

## 2019-08-12 VITALS — Temp 96.9°F

## 2019-08-12 DIAGNOSIS — M779 Enthesopathy, unspecified: Secondary | ICD-10-CM | POA: Diagnosis not present

## 2019-08-12 MED ORDER — TESTOSTERONE 12.5 MG/ACT (1%) TD GEL: TRANSDERMAL | 5 refills | Status: DC

## 2019-08-12 NOTE — Telephone Encounter (Signed)
Testosterone 12.5 MG/ACT (1%) [place], [place] Phone:  [phone]  Fax:  [phone]     Pt call and stated he is out of his Testosterone gel and need it .

## 2019-08-12 NOTE — Telephone Encounter (Signed)
This request is for [place]. Thanks.

## 2019-08-12 NOTE — Addendum Note (Signed)
Addended by: Eulas Post on: 08/12/2019 05:30 PM   Modules accepted: Orders

## 2019-08-12 NOTE — Progress Notes (Signed)
Subjective:   Patient ID: Jeff Ware, male   DOB: 64 y.o.   MRN: [id number]   HPI Patient presents with a painful area around the fifth metatarsal head right that is inflamed with fluid buildup and makes walking and shoe gear difficult with lesion formation.  States it is been going on a while and has been seen by another physician who is worked on it but it has not been effective recently   ROS      Objective:  Physical Exam  Neurovascular status intact with inflammation pain and fluid buildup around the fifth metatarsal head right with keratotic lesion formation     Assessment:  Inflammatory capsulitis fifth MPJ right with pain     Plan:  H&P condition reviewed did sterile prep and injected the plantar fifth MPJ capsule 3 mg Dexasone Kenalog 5 mg Xylocaine and debrided the lesion with bleeding due to the thinness of the skin but I applied sterile dressing with Neosporin.  Instructed him on soaks and reappoint as needed for this problem

## 2019-08-12 NOTE — Telephone Encounter (Signed)
Please verify pharmacy.  I [facility]

## 2019-08-14 ENCOUNTER — Telehealth: Payer: Self-pay | Admitting: Family Medicine

## 2019-08-14 NOTE — Telephone Encounter (Signed)
Patient called to explain that the wrong prescription was sent to [facility]. A commercial grade of 1% was sent in but he needs the 5%.

## 2019-08-15 ENCOUNTER — Telehealth: Payer: Self-pay | Admitting: Family Medicine

## 2019-08-15 NOTE — Telephone Encounter (Signed)
Betsy from [facility] called and received the request to fill the testosterone but they got back 1% instead of the 5% They want to confirm that [place] is changing it to the 1%   Phone: [phone] EXT [phone]

## 2019-08-15 NOTE — Telephone Encounter (Signed)
Pt called in to see if we can get him this medication as soon as possible, he will be going out of town for a week and half and needs the medication prior to leaving.   Patient Phone: [phone]

## 2019-08-16 ENCOUNTER — Other Ambulatory Visit: Payer: Self-pay

## 2019-08-16 NOTE — Telephone Encounter (Signed)
Please advise if this is 1% or 5%. Please see message.

## 2019-08-16 NOTE — Telephone Encounter (Signed)
Called Betsy at [facility] and gave her the message from Dr. Elease Hashimoto. Betsy verbalized an understanding.   Called patient and let him know that they are filling for him for correct dosage. Patient verbalized an understanding.

## 2019-08-16 NOTE — Telephone Encounter (Addendum)
Pt is leaving late afternoon and is needing the medication b/c he is out.  Informed the pt that Dr. Elease Hashimoto replied this morning about the percentage and they are working on it.   [facility] Fax: [phone] Phone: [phone]    Pt is asking how he can make this more smooth sailing for him b/c when he tries to get his medication refilled he has to call each time. He is wondering how he can get this automatically refilled or what he can do differently to make this easier for him?   Pt can be reached at [phone]

## 2019-08-16 NOTE — Telephone Encounter (Signed)
No.  This should be what he has been taking.  Need to see how this can be changed on his med list to reflect what he is taking accurately.  If he has been taking 5% then refill should be for 5%

## 2019-08-28 ENCOUNTER — Other Ambulatory Visit: Payer: Self-pay | Admitting: Family Medicine

## 2019-08-29 NOTE — Telephone Encounter (Signed)
This should have already been sent in on the 8th of this month.   Please call pharmacy to clarify.   He uses a custom formulation of testosterone

## 2019-08-29 NOTE — Telephone Encounter (Signed)
Called pharmacy and they stated medication was just refilled on the 12th. Refill not needed at this time.

## 2019-09-30 ENCOUNTER — Ambulatory Visit (INDEPENDENT_AMBULATORY_CARE_PROVIDER_SITE_OTHER): Payer: BC Managed Care – PPO

## 2019-09-30 ENCOUNTER — Other Ambulatory Visit: Payer: Self-pay

## 2019-09-30 DIAGNOSIS — M9901 Segmental and somatic dysfunction of cervical region: Secondary | ICD-10-CM | POA: Diagnosis not present

## 2019-09-30 DIAGNOSIS — T63441D Toxic effect of venom of bees, accidental (unintentional), subsequent encounter: Secondary | ICD-10-CM | POA: Diagnosis not present

## 2019-09-30 DIAGNOSIS — M47812 Spondylosis without myelopathy or radiculopathy, cervical region: Secondary | ICD-10-CM | POA: Diagnosis not present

## 2019-10-14 ENCOUNTER — Ambulatory Visit: Payer: BC Managed Care – PPO | Admitting: Dermatology

## 2019-11-06 ENCOUNTER — Encounter: Payer: Self-pay | Admitting: Dermatology

## 2019-11-06 ENCOUNTER — Other Ambulatory Visit: Payer: Self-pay

## 2019-11-06 ENCOUNTER — Ambulatory Visit (INDEPENDENT_AMBULATORY_CARE_PROVIDER_SITE_OTHER): Payer: BC Managed Care – PPO | Admitting: Dermatology

## 2019-11-06 DIAGNOSIS — C44229 Squamous cell carcinoma of skin of left ear and external auricular canal: Secondary | ICD-10-CM | POA: Diagnosis not present

## 2019-11-06 DIAGNOSIS — C44511 Basal cell carcinoma of skin of breast: Secondary | ICD-10-CM | POA: Diagnosis not present

## 2019-11-06 DIAGNOSIS — D229 Melanocytic nevi, unspecified: Secondary | ICD-10-CM

## 2019-11-06 DIAGNOSIS — Z85828 Personal history of other malignant neoplasm of skin: Secondary | ICD-10-CM | POA: Diagnosis not present

## 2019-11-06 DIAGNOSIS — C4491 Basal cell carcinoma of skin, unspecified: Secondary | ICD-10-CM

## 2019-11-06 DIAGNOSIS — D485 Neoplasm of uncertain behavior of skin: Secondary | ICD-10-CM | POA: Diagnosis not present

## 2019-11-06 DIAGNOSIS — C4492 Squamous cell carcinoma of skin, unspecified: Secondary | ICD-10-CM

## 2019-11-06 DIAGNOSIS — D225 Melanocytic nevi of trunk: Secondary | ICD-10-CM | POA: Diagnosis not present

## 2019-11-06 DIAGNOSIS — D0471 Carcinoma in situ of skin of right lower limb, including hip: Secondary | ICD-10-CM | POA: Diagnosis not present

## 2019-11-06 DIAGNOSIS — L570 Actinic keratosis: Secondary | ICD-10-CM

## 2019-11-06 HISTORY — DX: Squamous cell carcinoma of skin, unspecified: C44.92

## 2019-11-06 HISTORY — DX: Basal cell carcinoma of skin, unspecified: C44.91

## 2019-11-06 NOTE — Patient Instructions (Addendum)

## 2019-11-08 ENCOUNTER — Telehealth: Payer: Self-pay | Admitting: *Deleted

## 2019-11-08 ENCOUNTER — Encounter: Payer: Self-pay | Admitting: *Deleted

## 2019-11-08 ENCOUNTER — Encounter: Payer: Self-pay | Admitting: Dermatology

## 2019-11-08 NOTE — Progress Notes (Addendum)
   Follow-Up Visit   Subjective  Jeff Ware is a 64 y.o. male who presents for the following: Annual Exam (thick crust left hand left chest right leg).  New growths Location: Left ear, chest, left leg Duration: Less than 1 year Quality: Like spot has grown Associated Signs/Symptoms: Modifying Factors:  Severity:  Timing: Context: History of nonmelanoma skin cancers  The following portions of the chart were reviewed this encounter and updated as appropriate: Tobacco  Allergies  Meds  Problems  Med Hx  Surg Hx  Fam Hx      Objective  Well appearing patient in no apparent distress; mood and affect are within normal limits.  A full examination was performed including scalp, head, eyes, ears, nose, lips, neck, chest, axillae, abdomen, back, buttocks, bilateral upper extremities, bilateral lower extremities, hands, feet, fingers, toes, fingernails, and toenails. All findings within normal limits unless otherwise noted below.   Assessment & Plan  Neoplasm of uncertain behavior of skin (3) Left Superior Helix  Skin / nail biopsy Type of biopsy: tangential   Informed consent: discussed and consent obtained   Timeout: patient name, date of birth, surgical site, and procedure verified   Anesthesia: the lesion was anesthetized in a standard fashion   Anesthetic:  1% lidocaine w/ epinephrine 1-100,000 local infiltration Instrument used: flexible razor blade   Hemostasis achieved with: aluminum chloride   Outcome: patient tolerated procedure well   Post-procedure details: wound care instructions given    Specimen 1 - Surgical pathology Differential Diagnosis: BCC vs SCC Check Margins: No  Right Breast  Skin / nail biopsy Type of biopsy: tangential   Informed consent: discussed and consent obtained   Timeout: patient name, date of birth, surgical site, and procedure verified   Anesthesia: the lesion was anesthetized in a standard fashion   Anesthetic:  1% lidocaine w/  epinephrine 1-100,000 local infiltration Instrument used: flexible razor blade   Hemostasis achieved with: aluminum chloride   Outcome: patient tolerated procedure well   Post-procedure details: wound care instructions given    Specimen 2 - Surgical pathology Differential Diagnosis: BCC vs SCC Check Margins: No  Right Lower Leg - Anterior  Skin / nail biopsy Type of biopsy: tangential   Informed consent: discussed and consent obtained   Timeout: patient name, date of birth, surgical site, and procedure verified   Anesthesia: the lesion was anesthetized in a standard fashion   Anesthetic:  1% lidocaine w/ epinephrine 1-100,000 local infiltration Instrument used: flexible razor blade   Hemostasis achieved with: aluminum chloride   Outcome: patient tolerated procedure well   Post-procedure details: wound care instructions given    Specimen 3 - Surgical pathology Differential Diagnosis: BCC vs SCC Check Margins: No  AK (actinic keratosis) (4) Left Mid Helix (2); Left Temporal Scalp (2)  Destruction of lesion - Left Mid Helix, Left Temporal Scalp Complexity: simple   Destruction method: cryotherapy   Informed consent: discussed and consent obtained   Timeout:  patient name, date of birth, surgical site, and procedure verified Lesion destroyed using liquid nitrogen: Yes   Region frozen until ice ball extended beyond lesion: Yes   Outcome: patient tolerated procedure well with no complications   Post-procedure details: wound care instructions given    Nevus (2) Left Upper Back; Right Upper Back

## 2019-11-08 NOTE — Telephone Encounter (Signed)
Path to patient. Sur made with Dr. Denna Haggard. Patient requested a Monday appointment.

## 2019-11-08 NOTE — Telephone Encounter (Signed)
-----   Message from Lavonna Monarch, MD sent at 11/07/2019  8:56 PM EDT ----- Schedule surgery with Dr. Darene Lamer

## 2019-11-25 ENCOUNTER — Ambulatory Visit: Payer: Self-pay

## 2019-12-16 ENCOUNTER — Ambulatory Visit (INDEPENDENT_AMBULATORY_CARE_PROVIDER_SITE_OTHER): Payer: BC Managed Care – PPO

## 2019-12-16 ENCOUNTER — Other Ambulatory Visit: Payer: Self-pay

## 2019-12-16 ENCOUNTER — Encounter: Payer: Self-pay | Admitting: Dermatology

## 2019-12-16 ENCOUNTER — Ambulatory Visit (INDEPENDENT_AMBULATORY_CARE_PROVIDER_SITE_OTHER): Payer: BC Managed Care – PPO | Admitting: Dermatology

## 2019-12-16 DIAGNOSIS — L988 Other specified disorders of the skin and subcutaneous tissue: Secondary | ICD-10-CM | POA: Diagnosis not present

## 2019-12-16 DIAGNOSIS — T63441D Toxic effect of venom of bees, accidental (unintentional), subsequent encounter: Secondary | ICD-10-CM

## 2019-12-16 DIAGNOSIS — C44511 Basal cell carcinoma of skin of breast: Secondary | ICD-10-CM | POA: Diagnosis not present

## 2019-12-16 NOTE — Progress Notes (Addendum)
   Follow-Up Visit   Subjective  Jeff Ware is a 64 y.o. male who presents for the following: Procedure (here for treatment- Left superior helix- cis, right breast-bcc,nodular & right lower leg-anterior- CIS).  BCC upper right chest Location:  Duration:  Quality:  Associated Signs/Symptoms: Modifying Factors:  Severity:  Timing: Context: For treatment  The following portions of the chart were reviewed this encounter and updated as appropriate: Tobacco  Allergies  Meds  Problems  Med Hx  Surg Hx  Fam Hx      Objective  Well appearing patient in no apparent distress; mood and affect are within normal limits.  A focused examination was performed including Chest.. Relevant physical exam findings are noted in the Assessment and Plan.   Assessment & Plan  Basal cell carcinoma (BCC) of skin of right breast Right Breast  Skin excision  Lesion length (cm):  1.2 Lesion width (cm):  1 Margin per side (cm):  0.1 Total excision diameter (cm):  1.4 Informed consent: discussed and consent obtained   Timeout: patient name, date of birth, surgical site, and procedure verified   Procedure prep:  Patient was prepped and draped in usual sterile fashion Prep type:  Chlorhexidine Anesthesia: the lesion was anesthetized in a standard fashion   Anesthetic:  1% lidocaine w/ epinephrine 1-100,000 local infiltration Instrument used: #15 blade   Hemostasis achieved with: suture   Outcome: patient tolerated procedure well with no complications   Post-procedure details: sterile dressing applied and wound care instructions given   Dressing type: petrolatum    Skin excision  Specimen 1 - Surgical pathology Differential Diagnosis: bcc vs scc Check Margins: No  Initial curettage x3 showed to be greater than with (1.2 cm), so the base and edges were cauterized, recuretted, and narrow margin excision performed and closed with 1 central horizontal mattress 2 approximating sutures on  either side.  I instructed Mr. Balfour on removing the sutures in approximately 2 weeks ([place] beach).  And provided my cell number if there is any issues.  We will schedule a surgical time to do the lesions on his ear and leg in the future. Mr. Hamada has 3 biopsy-proven nonmelanoma skin cancers.  Those on the leg and ear are superficial or in situ while the one below the right inner clavicle is likely to be deep and require cutting and stitching, so the chest lesion was the initial one treated.  Triple curettage did show moderate depth so the base was cauterized, recuretted, and excised with narrow margin excision and simple closure.  Since Mr. Borba will be the beach I instructed him on proper technique for suture removal which can be done in 12 to 15days.  If there is any issue relating to this, he can reach me by cell phone at [phone].  He was warned about any risks relating to the minor surgery including recurrent skin cancer, infection, and scar (including keloid).  We will schedule to do the other 2 lesions in the future.

## 2019-12-16 NOTE — Patient Instructions (Signed)

## 2020-01-31 ENCOUNTER — Telehealth: Payer: Self-pay | Admitting: Family Medicine

## 2020-01-31 NOTE — Telephone Encounter (Signed)
Please advise 

## 2020-02-03 ENCOUNTER — Other Ambulatory Visit: Payer: Self-pay

## 2020-02-03 ENCOUNTER — Ambulatory Visit: Payer: Self-pay

## 2020-02-03 ENCOUNTER — Encounter: Payer: Self-pay | Admitting: Dermatology

## 2020-02-03 ENCOUNTER — Ambulatory Visit (INDEPENDENT_AMBULATORY_CARE_PROVIDER_SITE_OTHER): Payer: BC Managed Care – PPO | Admitting: Dermatology

## 2020-02-03 DIAGNOSIS — D099 Carcinoma in situ, unspecified: Secondary | ICD-10-CM

## 2020-02-03 DIAGNOSIS — C4492 Squamous cell carcinoma of skin, unspecified: Secondary | ICD-10-CM

## 2020-02-03 DIAGNOSIS — C44229 Squamous cell carcinoma of skin of left ear and external auricular canal: Secondary | ICD-10-CM | POA: Diagnosis not present

## 2020-02-03 DIAGNOSIS — D0461 Carcinoma in situ of skin of right upper limb, including shoulder: Secondary | ICD-10-CM | POA: Diagnosis not present

## 2020-02-03 NOTE — Telephone Encounter (Signed)
We can refill this for 6 months but I would call pharmacy to verify how this needs to be ordered.  It seems like every time we order this and try to go refill it there is some kind glitch.  I would check directly with pharmacy to make sure this is put in exactly the right way

## 2020-02-03 NOTE — Telephone Encounter (Signed)
How it is put in it correct

## 2020-02-03 NOTE — Progress Notes (Signed)
Left superior helix- C35FU- 1.3cm Right lower leg-anterior- CX35FU- 1.2cm

## 2020-02-03 NOTE — Progress Notes (Signed)
Follow-Up Visit   Subjective  Jeff Ware is a 64 y.o. male who presents for the following: Procedure (here for treatment- left superior helix- well diff scc & right lower leg-anterior- CIS x 1).  Skin cancers left ear and right leg Location:  Duration:  Quality:  Associated Signs/Symptoms: Modifying Factors:  Severity:  Timing: Context: For treatment  Objective  Well appearing patient in no apparent distress; mood and affect are within normal limits.  A focused examination was performed including Head, neck, chest, legs.. Relevant physical exam findings are noted in the Assessment and Plan.   Assessment & Plan Triple curette plus fluorouracil to superficial cancers on the left upper ear and the right shin. 5-second liquid nitrogen freeze to half dozen crusts on the dorsal right hand which are a mixture of solar keratoses and possible flat warts. 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  Destruction of lesion Complexity: simple   Destruction method: electrodesiccation and curettage   Informed consent: discussed and consent obtained   Timeout:  patient name, date of birth, surgical site, and procedure verified Anesthesia: the lesion was anesthetized in a standard fashion   Anesthetic:  1% lidocaine w/ epinephrine 1-100,000 local infiltration Curettage performed in three different directions: Yes   Curettage cycles:  3 Lesion length (cm):  1.2 Lesion width (cm):  1 Margin per side (cm):  0 Final wound size (cm):  1.2 Hemostasis achieved  with:  ferric subsulfate Outcome: patient tolerated procedure well with no complications   Post-procedure details: wound care instructions given  Additional details:  Inoculated with parenteral 5% fluorouracil  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  Destruction of lesion Complexity: simple   Destruction method: electrodesiccation and curettage   Informed consent: discussed and consent obtained   Timeout:  patient name, date of birth, surgical site, and procedure verified Anesthesia: the lesion was anesthetized in a standard fashion   Anesthetic:  1% lidocaine w/ epinephrine 1-100,000 local infiltration Curettage performed in three different directions: Yes   Curettage cycles:  3 Lesion length (cm):  1.5 Lesion width (cm):  1 Margin per side (cm):  0 Final wound size (cm):  1.5 Hemostasis achieved with:  ferric subsulfate Outcome: patient tolerated procedure well with no complications   Post-procedure details: wound care instructions given   Additional details:  Inoculated with parenteral 5% fluorouracil     I, Lavonna Monarch, MD, have reviewed all documentation for this visit.  The documentation on 03/11/20 for the exam, diagnosis, procedures, and orders are all accurate and complete.

## 2020-02-03 NOTE — Patient Instructions (Signed)

## 2020-02-04 NOTE — Telephone Encounter (Signed)
Pt spoke to [facility] pharmacy and they still do not have this refill on file. Pt is asking for a call back from the [place] once she has resolved this.   Pt can be reached at [phone]

## 2020-02-04 NOTE — Telephone Encounter (Signed)
This has been fixed and pt notified

## 2020-02-10 ENCOUNTER — Ambulatory Visit: Payer: Self-pay

## 2020-02-12 ENCOUNTER — Ambulatory Visit (INDEPENDENT_AMBULATORY_CARE_PROVIDER_SITE_OTHER): Payer: BC Managed Care – PPO

## 2020-02-12 ENCOUNTER — Other Ambulatory Visit: Payer: Self-pay

## 2020-02-12 DIAGNOSIS — T63441D Toxic effect of venom of bees, accidental (unintentional), subsequent encounter: Secondary | ICD-10-CM

## 2020-02-13 ENCOUNTER — Ambulatory Visit: Payer: Self-pay

## 2020-03-10 ENCOUNTER — Encounter: Payer: Self-pay | Admitting: Dermatology

## 2020-03-13 ENCOUNTER — Other Ambulatory Visit: Payer: Self-pay

## 2020-03-16 ENCOUNTER — Other Ambulatory Visit: Payer: Self-pay

## 2020-03-16 ENCOUNTER — Ambulatory Visit (INDEPENDENT_AMBULATORY_CARE_PROVIDER_SITE_OTHER): Payer: BC Managed Care – PPO | Admitting: Family Medicine

## 2020-03-16 ENCOUNTER — Encounter: Payer: Self-pay | Admitting: Family Medicine

## 2020-03-16 VITALS — BP 108/62 | HR 53 | Temp 98.1°F | Ht 68.0 in | Wt 165.0 lb

## 2020-03-16 DIAGNOSIS — Z23 Encounter for immunization: Secondary | ICD-10-CM | POA: Diagnosis not present

## 2020-03-16 DIAGNOSIS — Z0000 Encounter for general adult medical examination without abnormal findings: Secondary | ICD-10-CM

## 2020-03-16 NOTE — Patient Instructions (Signed)
Preventive Care 41-64 Years Old, Male Preventive care refers to lifestyle choices and visits with your health care provider that can promote health and wellness. This includes:  A yearly physical exam. This is also called an annual well check.  Regular dental and eye exams.  Immunizations.  Screening for certain conditions.  Healthy lifestyle choices, such as eating a healthy diet, getting regular exercise, not using drugs or products that contain nicotine and tobacco, and limiting alcohol use. What can I expect for my preventive care visit? Physical exam Your health care provider will check:  Height and weight. These may be used to calculate body mass index (BMI), which is a measurement that tells if you are at a healthy weight.  Heart rate and blood pressure.  Your skin for abnormal spots. Counseling Your health care provider may ask you questions about:  Alcohol, tobacco, and drug use.  Emotional well-being.  Home and relationship well-being.  Sexual activity.  Eating habits.  Work and work Statistician. What immunizations do I need?  Influenza (flu) vaccine  This is recommended every year. Tetanus, diphtheria, and pertussis (Tdap) vaccine  You may need a Td booster every 10 years. Varicella (chickenpox) vaccine  You may need this vaccine if you have not already been vaccinated. Zoster (shingles) vaccine  You may need this after age 64. Measles, mumps, and rubella (MMR) vaccine  You may need at least one dose of MMR if you were born in 1957 or later. You may also need a second dose. Pneumococcal conjugate (PCV13) vaccine  You may need this if you have certain conditions and were not previously vaccinated. Pneumococcal polysaccharide (PPSV23) vaccine  You may need one or two doses if you smoke cigarettes or if you have certain conditions. Meningococcal conjugate (MenACWY) vaccine  You may need this if you have certain conditions. Hepatitis A  vaccine  You may need this if you have certain conditions or if you travel or work in places where you may be exposed to hepatitis A. Hepatitis B vaccine  You may need this if you have certain conditions or if you travel or work in places where you may be exposed to hepatitis B. Haemophilus influenzae type b (Hib) vaccine  You may need this if you have certain risk factors. Human papillomavirus (HPV) vaccine  If recommended by your health care provider, you may need three doses over 6 months. You may receive vaccines as individual doses or as more than one vaccine together in one shot (combination vaccines). Talk with your health care provider about the risks and benefits of combination vaccines. What tests do I need? Blood tests  Lipid and cholesterol levels. These may be checked every 5 years, or more frequently if you are over 60 years old.  Hepatitis C test.  Hepatitis B test. Screening  Lung cancer screening. You may have this screening every year starting at age 43 if you have a 30-pack-year history of smoking and currently smoke or have quit within the past 15 years.  Prostate cancer screening. Recommendations will vary depending on your family history and other risks.  Colorectal cancer screening. All adults should have this screening starting at age 72 and continuing until age 2. Your health care provider may recommend screening at age 14 if you are at increased risk. You will have tests every 1-10 years, depending on your results and the type of screening test.  Diabetes screening. This is done by checking your blood sugar (glucose) after you have not eaten  for a while (fasting). You may have this done every 1-3 years.  Sexually transmitted disease (STD) testing. Follow these instructions at home: Eating and drinking  Eat a diet that includes fresh fruits and vegetables, whole grains, lean protein, and low-fat dairy products.  Take vitamin and mineral supplements as  recommended by your health care provider.  Do not drink alcohol if your health care provider tells you not to drink.  If you drink alcohol: ? Limit how much you have to 0-2 drinks a day. ? Be aware of how much alcohol is in your drink. In the [place] one drink equals one 12 oz bottle of beer (355 mL), one 5 oz glass of wine (148 mL), or one 1 oz glass of hard liquor (44 mL). Lifestyle  Take daily care of your teeth and gums.  Stay active. Exercise for at least 30 minutes on 5 or more days each week.  Do not use any products that contain nicotine or tobacco, such as cigarettes, e-cigarettes, and chewing tobacco. If you need help quitting, ask your health care provider.  If you are sexually active, practice safe sex. Use a condom or other form of protection to prevent STIs (sexually transmitted infections).  Talk with your health care provider about taking a low-dose aspirin every day starting at age 53. What's next?  Go to your health care provider once a year for a well check visit.  Ask your health care provider how often you should have your eyes and teeth checked.  Stay up to date on all vaccines. This information is not intended to replace advice given to you by your health care provider. Make sure you discuss any questions you have with your health care provider. Document Revised: 06/14/2018 Document Reviewed: 06/14/2018 Elsevier Patient Education  2020 [employer].

## 2020-03-16 NOTE — Progress Notes (Signed)
Established Patient Office Visit  Subjective:  Patient ID: Jeff Ware, male    DOB: Jun 18, 1956  Age: 65 y.o. MRN: [id number]  CC:  Chief Complaint  Patient presents with  . Follow-up    HPI Jeff Ware presents for physical exam.  He is retired.  He is living down at [place] with his wife.  Still has home here and comes back a few days per month.  One of his sons got married couple weeks ago.  He has seen dermatologist frequently during the past year.  He has had history of both basal cell and squamous cell carcinomas.  He has history of hypogonadism.  He is on topical testosterone replacement.  Exercising regularly.  He does a combination of some cycling and running as well as weights.  Remote history of testicular cancer  Health maintenance reviewed  -He has had Covid vaccine but does not have specific dates -No flu vaccine yet -Tetanus due 2023 -Colonoscopy due 2028 -Previous hepatitis C screen negative -Has had previous Zostavax and declines Shingrix  Past Medical History:  Diagnosis Date  . Atypical nevi 09/23/2010   right low back (moderate)  . Basal cell carcinoma 11/06/2019   right breast- (CX35FU+EXC)  . Hyperlipidemia   . SCC (squamous cell carcinoma) 11/06/2019   Left superior helix(CX35FU)  . SCC (squamous cell carcinoma) 11/06/2019   right breast(EXC)  . SCC (squamous cell carcinoma) 11/06/2019   right lower leg anterior  . SCCA (squamous cell carcinoma) of skin 03/26/2014   Right Collarbone(in situ) (curet and 5FU)  . SCCA (squamous cell carcinoma) of skin 05/08/2018   Right Ear Rim(in situ) (curet and 5FU)  . Squamous cell carcinoma in situ (SCCIS) 03/26/2014   scc in situ Right collar bone  . Squamous cell carcinoma in situ (SCCIS) 05/08/2018   Right ear rim  . Testicular cancer [facility])     Past Surgical History:  Procedure Laterality Date  . COLONOSCOPY    . KNEE SURGERY    . orchiectomy left  2006  . POLYPECTOMY    . ROTATOR CUFF  REPAIR      Family History  Problem Relation Age of Onset  . Heart disease Father   . Seizures Father   . Colon cancer Neg Hx   . Colon polyps Neg Hx   . Esophageal cancer Neg Hx   . Rectal cancer Neg Hx   . Stomach cancer Neg Hx     Social History   Socioeconomic History  . Marital status: Married    Spouse name: Tiara Bartoli  . Number of children: Not on file  . Years of education: Not on file  . Highest education level: Not on file  Occupational History  . Not on file  Tobacco Use  . Smoking status: Never Smoker  . Smokeless tobacco: Never Used  Vaping Use  . Vaping Use: Never used  Substance and Sexual Activity  . Alcohol use: No  . Drug use: No  . Sexual activity: Yes  Other Topics Concern  . Not on file  Social History Narrative  . Not on file   Social Determinants of Health   Financial Resource Strain:   . Difficulty of Paying Living Expenses: Not on file  Food Insecurity:   . Worried About Charity fundraiser in the Last Year: Not on file  . Ran Out of Food in the Last Year: Not on file  Transportation Needs:   . Lack of Transportation (Medical):  Not on file  . Lack of Transportation (Non-Medical): Not on file  Physical Activity:   . Days of Exercise per Week: Not on file  . Minutes of Exercise per Session: Not on file  Stress:   . Feeling of Stress : Not on file  Social Connections:   . Frequency of Communication with Friends and Family: Not on file  . Frequency of Social Gatherings with Friends and Family: Not on file  . Attends Religious Services: Not on file  . Active Member of Clubs or Organizations: Not on file  . Attends Archivist Meetings: Not on file  . Marital Status: Not on file  Intimate Partner Violence:   . Fear of Current or Ex-Partner: Not on file  . Emotionally Abused: Not on file  . Physically Abused: Not on file  . Sexually Abused: Not on file    Outpatient Medications Prior to Visit  Medication Sig Dispense  Refill  . aspirin 81 MG tablet Take 81 mg by mouth daily.      Marland Kitchen EPINEPHrine (EPIPEN 2-PAK) 0.3 mg/0.3 mL IJ SOAJ injection Inject into the muscle once.    . fish oil-omega-3 fatty acids 1000 MG capsule Take 2 g by mouth 2 (two) times daily.      . fluorouracil (EFUDEX) 5 % cream     . multivitamin (THERAGRAN) per tablet Take 1 tablet by mouth daily.    . Red Yeast Rice 600 MG TABS Take 600 mg by mouth daily.      . Testosterone 12.5 MG/ACT (1%) GEL Apply 26ml topically once a day as directed. 30 g 5   No facility-administered medications prior to visit.    Allergies  Allergen Reactions  . Bee Venom Other (See Comments)    bp drops and starts sweating     ROS Review of Systems  Constitutional: Negative for appetite change, chills, fever and unexpected weight change.  Eyes: Negative for visual disturbance.  Respiratory: Negative for cough and shortness of breath.   Cardiovascular: Negative for chest pain.  Gastrointestinal: Negative for abdominal pain, nausea and vomiting.  Genitourinary: Negative for dysuria.  Neurological: Negative for headaches.  Hematological: Negative for adenopathy.      Objective:    Physical Exam Vitals reviewed.  Constitutional:      Appearance: Normal appearance.  HENT:     Right Ear: Ear canal normal.     Left Ear: Ear canal normal.  Cardiovascular:     Rate and Rhythm: Normal rate and regular rhythm.  Pulmonary:     Effort: Pulmonary effort is normal.     Breath sounds: Normal breath sounds.  Abdominal:     Palpations: Abdomen is soft.     Tenderness: There is no abdominal tenderness.  Musculoskeletal:     Cervical back: Neck supple.     Right lower leg: No edema.     Left lower leg: No edema.  Lymphadenopathy:     Cervical: No cervical adenopathy.  Neurological:     General: No focal deficit present.     Mental Status: He is alert.     Cranial Nerves: No cranial nerve deficit.     BP 108/62   Pulse (!) 53   Temp 98.1 F (36.7  C) (Oral)   Ht 5\' 8"  (1.727 m)   Wt 165 lb (74.8 kg)   SpO2 94%   BMI 25.09 kg/m  Wt Readings from Last 3 Encounters:  03/16/20 165 lb (74.8 kg)  02/25/19 164 lb  6.4 oz (74.6 kg)  10/09/18 166 lb 6.4 oz (75.5 kg)     Health Maintenance Due  Topic Date Due  . COVID-19 Vaccine (1) Never done  . HIV Screening  Never done    There are no preventive care reminders to display for this patient.  Lab Results  Component Value Date   TSH 0.67 02/25/2019   Lab Results  Component Value Date   WBC 6.2 02/25/2019   HGB 14.2 02/25/2019   HCT 41.7 02/25/2019   MCV 92.5 02/25/2019   PLT 207.0 02/25/2019   Lab Results  Component Value Date   NA 140 02/25/2019   K 4.0 02/25/2019   CO2 26 02/25/2019   GLUCOSE 86 02/25/2019   BUN 18 02/25/2019   CREATININE 0.99 02/25/2019   BILITOT 0.8 02/25/2019   ALKPHOS 65 02/25/2019   AST 23 02/25/2019   ALT 17 02/25/2019   PROT 6.7 02/25/2019   ALBUMIN 4.4 02/25/2019   CALCIUM 9.3 02/25/2019   GFR 76.23 02/25/2019   Lab Results  Component Value Date   CHOL 205 (H) 02/25/2019   Lab Results  Component Value Date   HDL 49.50 02/25/2019   Lab Results  Component Value Date   LDLCALC 143 (H) 02/25/2019   Lab Results  Component Value Date   TRIG 63.0 02/25/2019   Lab Results  Component Value Date   CHOLHDL 4 02/25/2019   No results found for: HGBA1C    Assessment & Plan:   Problem List Items Addressed This Visit    None    Visit Diagnoses    Physical exam    -  Primary   Relevant Orders   Basic metabolic panel   Lipid panel   CBC with Differential/Platelet   TSH   Hepatic function panel   PSA   Testosterone   Need for immunization against influenza       Relevant Orders   Flu Vaccine QUAD 36+ mos IM (Completed)    -Flu vaccine given -Obtain follow-up labs.  Include PSA and total testosterone with his history of low testosterone on replacement -We discussed Shingrix and he declines at this time -He has already  received Covid vaccine but we did not have dates for that.  No orders of the defined types were placed in this encounter.   Follow-up: No follow-ups on file.    Carolann Littler, MD

## 2020-03-17 LAB — HEPATIC FUNCTION PANEL
AG Ratio: 1.8 (calc) (ref 1.0–2.5)
ALT: 15 U/L (ref 9–46)
AST: 19 U/L (ref 10–35)
Albumin: 4.6 g/dL (ref 3.6–5.1)
Alkaline phosphatase (APISO): 70 U/L (ref 35–144)
Bilirubin, Direct: 0.1 mg/dL (ref 0.0–0.2)
Globulin: 2.6 g/dL (calc) (ref 1.9–3.7)
Indirect Bilirubin: 0.5 mg/dL (calc) (ref 0.2–1.2)
Total Bilirubin: 0.6 mg/dL (ref 0.2–1.2)
Total Protein: 7.2 g/dL (ref 6.1–8.1)

## 2020-03-17 LAB — LIPID PANEL
Cholesterol: 198 mg/dL (ref <200)
HDL: 55 mg/dL (ref >=40)
LDL Cholesterol (Calc): 129 mg/dL (calc) — ABNORMAL HIGH
Non-HDL Cholesterol (Calc): 143 mg/dL (calc) — ABNORMAL HIGH (ref <130)
Total CHOL/HDL Ratio: 3.6 (calc) (ref <5.0)
Triglycerides: 50 mg/dL (ref <150)

## 2020-03-17 LAB — CBC WITH DIFFERENTIAL/PLATELET
Absolute Monocytes: 576 cells/uL (ref 200–950)
Basophils Absolute: 34 cells/uL (ref 0–200)
Basophils Relative: 0.4 %
Eosinophils Absolute: 17 cells/uL (ref 15–500)
Eosinophils Relative: 0.2 %
HCT: 43.3 % (ref 38.5–50.0)
Hemoglobin: 15 g/dL (ref 13.2–17.1)
Lymphs Abs: 1075 cells/uL (ref 850–3900)
MCH: 32.2 pg (ref 27.0–33.0)
MCHC: 34.6 g/dL (ref 32.0–36.0)
MCV: 92.9 fL (ref 80.0–100.0)
MPV: 10.4 fL (ref 7.5–12.5)
Monocytes Relative: 6.7 %
Neutro Abs: 6897 cells/uL (ref 1500–7800)
Neutrophils Relative %: 80.2 %
Platelets: 222 10*3/uL (ref 140–400)
RBC: 4.66 10*6/uL (ref 4.20–5.80)
RDW: 13.3 % (ref 11.0–15.0)
Total Lymphocyte: 12.5 %
WBC: 8.6 10*3/uL (ref 3.8–10.8)

## 2020-03-17 LAB — BASIC METABOLIC PANEL
BUN: 24 mg/dL (ref 7–25)
CO2: 27 mmol/L (ref 20–32)
Calcium: 9.6 mg/dL (ref 8.6–10.3)
Chloride: 107 mmol/L (ref 98–110)
Creat: 1.21 mg/dL (ref 0.70–1.25)
Glucose, Bld: 83 mg/dL (ref 65–99)
Potassium: 4.3 mmol/L (ref 3.5–5.3)
Sodium: 141 mmol/L (ref 135–146)

## 2020-03-17 LAB — TESTOSTERONE: Testosterone: 1107 ng/dL — ABNORMAL HIGH (ref 250–827)

## 2020-03-17 LAB — PSA: PSA: 1.16 ng/mL (ref <=4.0)

## 2020-03-17 LAB — TSH: TSH: 0.84 mIU/L (ref 0.40–4.50)

## 2020-04-13 ENCOUNTER — Ambulatory Visit: Payer: Self-pay

## 2020-04-22 ENCOUNTER — Ambulatory Visit (INDEPENDENT_AMBULATORY_CARE_PROVIDER_SITE_OTHER): Payer: BC Managed Care – PPO | Admitting: *Deleted

## 2020-04-22 ENCOUNTER — Other Ambulatory Visit: Payer: Self-pay

## 2020-04-22 DIAGNOSIS — T63441D Toxic effect of venom of bees, accidental (unintentional), subsequent encounter: Secondary | ICD-10-CM | POA: Diagnosis not present

## 2020-04-29 ENCOUNTER — Encounter: Payer: Self-pay | Admitting: Family Medicine

## 2020-05-04 ENCOUNTER — Other Ambulatory Visit: Payer: Self-pay

## 2020-05-04 ENCOUNTER — Ambulatory Visit (INDEPENDENT_AMBULATORY_CARE_PROVIDER_SITE_OTHER): Payer: BC Managed Care – PPO | Admitting: Dermatology

## 2020-05-04 ENCOUNTER — Encounter: Payer: Self-pay | Admitting: Dermatology

## 2020-05-04 DIAGNOSIS — Z86007 Personal history of in-situ neoplasm of skin: Secondary | ICD-10-CM | POA: Diagnosis not present

## 2020-05-04 DIAGNOSIS — Z85828 Personal history of other malignant neoplasm of skin: Secondary | ICD-10-CM | POA: Diagnosis not present

## 2020-05-04 DIAGNOSIS — L570 Actinic keratosis: Secondary | ICD-10-CM | POA: Diagnosis not present

## 2020-05-04 DIAGNOSIS — Z8589 Personal history of malignant neoplasm of other organs and systems: Secondary | ICD-10-CM

## 2020-05-04 NOTE — Progress Notes (Signed)
Patient to return for PDT red light on his face with 90 minute incubation.

## 2020-05-05 ENCOUNTER — Encounter: Payer: Self-pay | Admitting: Dermatology

## 2020-05-05 NOTE — Progress Notes (Signed)
° °  Follow-Up Visit   Subjective  Jeff Ware is a 64 y.o. male who presents for the following: Follow-up (3 month follow up for previous treatment of left upper ear and right shin per patient healed well.  Check LN2 place on his right hand.).  Follow-up skin cancers Location:  Duration:  Quality:  Associated Signs/Symptoms: Modifying Factors:  Severity:  Timing: Context:   Objective  Well appearing patient in no apparent distress; mood and affect are within normal limits.  All sun exposed areas plus back examined.   Assessment & Plan    History of squamous cell carcinoma Left Superior Helix  No further treatment needed today  History of squamous cell carcinoma in situ (SCCIS) of skin Right Lower Leg - Anterior  No further treatment needed today  History of basal cell carcinoma (BCC) Right Breast  Annual skin exam  AK (actinic keratosis) (3) Head - Anterior (Face); Left Dorsal Hand; Right Dorsal Hand  PDT Red Light 90 minutes incubation      I, Lavonna Monarch, MD, have reviewed all documentation for this visit.  The documentation on 05/05/20 for the exam, diagnosis, procedures, and orders are all accurate and complete.

## 2020-06-08 ENCOUNTER — Other Ambulatory Visit: Payer: Self-pay

## 2020-06-08 ENCOUNTER — Ambulatory Visit (INDEPENDENT_AMBULATORY_CARE_PROVIDER_SITE_OTHER): Payer: BC Managed Care – PPO | Admitting: *Deleted

## 2020-06-08 DIAGNOSIS — L570 Actinic keratosis: Secondary | ICD-10-CM

## 2020-06-08 MED ORDER — AMINOLEVULINIC ACID HCL 10 % EX GEL
2000.0000 mg | Freq: Once | CUTANEOUS | Status: AC
  Administered 2020-06-08: 2000 mg via TOPICAL

## 2020-06-08 NOTE — Patient Instructions (Signed)

## 2020-06-08 NOTE — Progress Notes (Signed)
Photodynamic Therapy Procedure Note Diagnosis: Actinic keratosis Location: Forehead & Crown of scalp Informed Consent: Discussed risks (burning, pain, redness, peeling, severe sunburn-like reaction, blistering, discoloration, lack of resolution) and benefits of the procedure, as well as the alternatives. Informed consent was obtained. Preparation: After cleansing the skin, the area to be treated was coated with Am.  This was allowed to sit on the skin for 1.5 hours. Procedure Details: The patient was placed under the light source with appropriate eye protection for 16 minutes 42 seconds After completing the treatment, the patient applied sunscreen to the treated areas. Patient tolerated the procedure well Plan: Avoid any sun exposure for the next 24 hours. Wear sunscreen daily for the next week. Observe normal sun precautions thereafter. Recommend OTC analgesia as needed for pain. Follow-up in 12 weeks.

## 2020-06-30 ENCOUNTER — Other Ambulatory Visit: Payer: Self-pay

## 2020-06-30 ENCOUNTER — Ambulatory Visit (INDEPENDENT_AMBULATORY_CARE_PROVIDER_SITE_OTHER): Payer: BC Managed Care – PPO | Admitting: *Deleted

## 2020-06-30 DIAGNOSIS — T63441D Toxic effect of venom of bees, accidental (unintentional), subsequent encounter: Secondary | ICD-10-CM | POA: Diagnosis not present

## 2020-07-21 ENCOUNTER — Telehealth: Payer: Self-pay | Admitting: Family Medicine

## 2020-07-21 NOTE — Telephone Encounter (Signed)
Pt is calling in needing a refill on testosterone 12.5 MG  Gel   Pharm:  [facility].

## 2020-07-22 ENCOUNTER — Telehealth: Payer: Self-pay

## 2020-07-22 MED ORDER — TESTOSTERONE 12.5 MG/ACT (1%) TD GEL
TRANSDERMAL | 5 refills | Status: DC
Start: 2020-07-22 — End: 2020-07-22

## 2020-07-22 NOTE — Telephone Encounter (Signed)
Updated testosterone rx.

## 2020-08-31 ENCOUNTER — Other Ambulatory Visit: Payer: Self-pay

## 2020-08-31 ENCOUNTER — Encounter: Payer: Self-pay | Admitting: Dermatology

## 2020-08-31 ENCOUNTER — Ambulatory Visit (INDEPENDENT_AMBULATORY_CARE_PROVIDER_SITE_OTHER): Payer: BC Managed Care – PPO

## 2020-08-31 ENCOUNTER — Ambulatory Visit (INDEPENDENT_AMBULATORY_CARE_PROVIDER_SITE_OTHER): Payer: BC Managed Care – PPO | Admitting: Dermatology

## 2020-08-31 DIAGNOSIS — L570 Actinic keratosis: Secondary | ICD-10-CM

## 2020-08-31 DIAGNOSIS — L820 Inflamed seborrheic keratosis: Secondary | ICD-10-CM | POA: Diagnosis not present

## 2020-08-31 DIAGNOSIS — Z1283 Encounter for screening for malignant neoplasm of skin: Secondary | ICD-10-CM | POA: Diagnosis not present

## 2020-08-31 DIAGNOSIS — T63441D Toxic effect of venom of bees, accidental (unintentional), subsequent encounter: Secondary | ICD-10-CM | POA: Diagnosis not present

## 2020-08-31 DIAGNOSIS — D485 Neoplasm of uncertain behavior of skin: Secondary | ICD-10-CM

## 2020-08-31 DIAGNOSIS — L821 Other seborrheic keratosis: Secondary | ICD-10-CM | POA: Diagnosis not present

## 2020-09-02 ENCOUNTER — Encounter: Payer: Self-pay | Admitting: Dermatology

## 2020-09-08 NOTE — Progress Notes (Signed)
   Follow-Up Visit   Subjective  Jeff Ware is a 65 y.o. male who presents for the following: Follow-up (PDT NO REAL REACTION TOPICAL TREATMENT WAS MUCH BETTER).  Actinic keratosis Location:  Duration:  Quality: Modest improvement Associated Signs/Symptoms: Modifying Factors: PDT Severity:  Timing: Context:   Objective  Well appearing patient in no apparent distress; mood and affect are within normal limits. Objective  Right Abdomen (side) - Upper: Waist up skin examination no atypical pigmented lesions, no recurrent nonmole skin cancer.  Objective  Mid Back: Flattopped 4-8 mm papules  Objective  Right Upper Back: Bi-chromic Gray/dark brown 6 mm macule; dermoscopy is amorphous with no pseudocysts.     Objective  Left Forearm - Posterior (2), Left Frontal Scalp, Left Scaphoid Fossa, Right Frontal Scalp: I do feel there are perhaps 50% fewer actinic keratoses on the PDT treatment area but I understand Jeff Ware disappointment. Thicker residual actinic keratoses on the scalp (2) and ear along with one on the forearm will be treated with liquid nitrogen freeze.    All skin waist up examined.   Assessment & Plan    Encounter for screening for malignant neoplasm of skin Right Abdomen (side) - Upper  Yearly skin check of examine his skin with spouse twice annually.  Seborrheic keratosis Mid Back  Okay to leave if stable  Neoplasm of uncertain behavior of skin Right Upper Back  Skin / nail biopsy Type of biopsy: tangential   Informed consent: discussed and consent obtained   Timeout: patient name, date of birth, surgical site, and procedure verified   Procedure prep:  Patient was prepped and draped in usual sterile fashion (Non sterile) Prep type:  Chlorhexidine Anesthesia: the lesion was anesthetized in a standard fashion   Anesthetic:  1% lidocaine w/ epinephrine 1-100,000 local infiltration Instrument used: flexible razor blade   Outcome: patient  tolerated procedure well   Post-procedure details: wound care instructions given    Specimen 1 - Surgical pathology Differential Diagnosis: r/o atypia  Check Margins: No  AK (actinic keratosis) (5) Left Forearm - Posterior (2); Left Scaphoid Fossa; Left Frontal Scalp; Right Frontal Scalp  Destruction of lesion - Left Forearm - Posterior, Left Frontal Scalp, Left Scaphoid Fossa, Right Frontal Scalp Complexity: simple   Destruction method: cryotherapy   Informed consent: discussed and consent obtained   Timeout:  patient name, date of birth, surgical site, and procedure verified Lesion destroyed using liquid nitrogen: Yes   Cryotherapy cycles:  3 Outcome: patient tolerated procedure well with no complications        I, Lavonna Monarch, MD, have reviewed all documentation for this visit.  The documentation on 09/08/20 for the exam, diagnosis, procedures, and orders are all accurate and complete.

## 2020-10-19 ENCOUNTER — Ambulatory Visit: Payer: Self-pay

## 2020-10-26 ENCOUNTER — Other Ambulatory Visit: Payer: Self-pay

## 2020-10-26 ENCOUNTER — Ambulatory Visit (INDEPENDENT_AMBULATORY_CARE_PROVIDER_SITE_OTHER): Payer: Medicare Other | Admitting: *Deleted

## 2020-10-26 DIAGNOSIS — T63441D Toxic effect of venom of bees, accidental (unintentional), subsequent encounter: Secondary | ICD-10-CM

## 2020-12-15 ENCOUNTER — Telehealth: Payer: Self-pay | Admitting: Family Medicine

## 2020-12-15 MED ORDER — NONFORMULARY OR COMPOUNDED ITEM: 5 refills | Status: DC

## 2020-12-15 NOTE — Telephone Encounter (Signed)
Last office visit- 03/16/20 Last refill- medication is currently not on med list  No future office visit scheduled

## 2020-12-15 NOTE — Telephone Encounter (Signed)
[facility] and prescription was faxed over today and they did receive this.

## 2020-12-15 NOTE — Telephone Encounter (Signed)
Pt call and stated he need a RX on testosterone sent to  [facility], [place] Phone:  [phone]  Fax:  [phone]

## 2020-12-21 ENCOUNTER — Ambulatory Visit (INDEPENDENT_AMBULATORY_CARE_PROVIDER_SITE_OTHER): Payer: Medicare Other

## 2020-12-21 ENCOUNTER — Other Ambulatory Visit: Payer: Self-pay

## 2020-12-21 DIAGNOSIS — T63441D Toxic effect of venom of bees, accidental (unintentional), subsequent encounter: Secondary | ICD-10-CM

## 2021-02-15 ENCOUNTER — Ambulatory Visit: Payer: Self-pay

## 2021-02-22 ENCOUNTER — Ambulatory Visit (INDEPENDENT_AMBULATORY_CARE_PROVIDER_SITE_OTHER): Payer: Medicare Other | Admitting: *Deleted

## 2021-02-22 ENCOUNTER — Other Ambulatory Visit: Payer: Self-pay

## 2021-02-22 ENCOUNTER — Encounter: Payer: Self-pay | Admitting: Dermatology

## 2021-02-22 ENCOUNTER — Ambulatory Visit: Payer: Medicare Other | Admitting: Dermatology

## 2021-02-22 DIAGNOSIS — D485 Neoplasm of uncertain behavior of skin: Secondary | ICD-10-CM

## 2021-02-22 DIAGNOSIS — T63441D Toxic effect of venom of bees, accidental (unintentional), subsequent encounter: Secondary | ICD-10-CM

## 2021-02-22 DIAGNOSIS — L570 Actinic keratosis: Secondary | ICD-10-CM

## 2021-02-22 DIAGNOSIS — D0471 Carcinoma in situ of skin of right lower limb, including hip: Secondary | ICD-10-CM | POA: Diagnosis not present

## 2021-02-22 NOTE — Patient Instructions (Signed)

## 2021-03-01 ENCOUNTER — Ambulatory Visit: Payer: BC Managed Care – PPO | Admitting: Dermatology

## 2021-03-05 ENCOUNTER — Encounter: Payer: Self-pay | Admitting: Dermatology

## 2021-03-05 NOTE — Progress Notes (Signed)
   Follow-Up Visit   Subjective  Jeff Ware is a 65 y.o. male who presents for the following: Follow-up (Right thigh- new lesion- + itch).  New crust on right leg plus small crusts on ears. Location:  Duration:  Quality:  Associated Signs/Symptoms: Modifying Factors:  Severity:  Timing: Context:   Objective  Well appearing patient in no apparent distress; mood and affect are within normal limits. left and right ear (2) Gritty 4 mm pink crusts  Right Knee - Anterior 9 mm waxy pink crust compatible with superficial carcinoma       A focused examination was performed including head, neck, arms, legs.. Relevant physical exam findings are noted in the Assessment and Plan.   Assessment & Plan    AK (actinic keratosis) (2) left and right ear  Destruction of lesion - left and right ear Complexity: simple   Destruction method: cryotherapy   Informed consent: discussed and consent obtained   Lesion destroyed using liquid nitrogen: Yes   Cryotherapy cycles:  3 Outcome: patient tolerated procedure well with no complications    Carcinoma in situ of skin of right lower extremity including hip Right Knee - Anterior  Skin / nail biopsy Type of biopsy: tangential   Informed consent: discussed and consent obtained   Timeout: patient name, date of birth, surgical site, and procedure verified   Anesthesia: the lesion was anesthetized in a standard fashion   Anesthetic:  1% lidocaine w/ epinephrine 1-100,000 local infiltration Instrument used: flexible razor blade   Hemostasis achieved with: aluminum chloride and electrodesiccation   Outcome: patient tolerated procedure well   Post-procedure details: wound care instructions given    Destruction of lesion Complexity: simple   Destruction method: electrodesiccation and curettage   Informed consent: discussed and consent obtained   Timeout:  patient name, date of birth, surgical site, and procedure verified Anesthesia:  the lesion was anesthetized in a standard fashion   Anesthetic:  1% lidocaine w/ epinephrine 1-100,000 local infiltration Curettage performed in three different directions: Yes   Curettage cycles:  3 Lesion length (cm):  1.1 Lesion width (cm):  1.1 Margin per side (cm):  0 Final wound size (cm):  1.1 Hemostasis achieved with:  aluminum chloride Outcome: patient tolerated procedure well with no complications   Post-procedure details: wound care instructions given    Specimen 1 - Surgical pathology Differential Diagnosis: bcc scc tx with bx curet cautery  Check Margins: No  After shave biopsy the base was treated with curettage plus cautery.      I, Lavonna Monarch, MD, have reviewed all documentation for this visit.  The documentation on 03/05/21 for the exam, diagnosis, procedures, and orders are all accurate and complete.

## 2021-03-22 ENCOUNTER — Ambulatory Visit (INDEPENDENT_AMBULATORY_CARE_PROVIDER_SITE_OTHER): Payer: Medicare Other | Admitting: Family Medicine

## 2021-03-22 ENCOUNTER — Other Ambulatory Visit: Payer: Self-pay

## 2021-03-22 VITALS — BP 122/64 | HR 55 | Ht 68.5 in | Wt 162.5 lb

## 2021-03-22 DIAGNOSIS — Z0000 Encounter for general adult medical examination without abnormal findings: Secondary | ICD-10-CM

## 2021-03-22 DIAGNOSIS — Z23 Encounter for immunization: Secondary | ICD-10-CM | POA: Diagnosis not present

## 2021-03-22 LAB — PSA: PSA: 2.27 ng/mL (ref 0.10–4.00)

## 2021-03-22 LAB — CBC WITH DIFFERENTIAL/PLATELET
Basophils Absolute: 0 10*3/uL (ref 0.0–0.1)
Basophils Relative: 0.6 % (ref 0.0–3.0)
Eosinophils Absolute: 0.1 10*3/uL (ref 0.0–0.7)
Eosinophils Relative: 1.6 % (ref 0.0–5.0)
HCT: 42.4 % (ref 39.0–52.0)
Hemoglobin: 14.7 g/dL (ref 13.0–17.0)
Lymphocytes Relative: 25.5 % (ref 12.0–46.0)
Lymphs Abs: 1.2 10*3/uL (ref 0.7–4.0)
MCHC: 34.5 g/dL (ref 30.0–36.0)
MCV: 92.1 fl (ref 78.0–100.0)
Monocytes Absolute: 0.4 10*3/uL (ref 0.1–1.0)
Monocytes Relative: 8.5 % (ref 3.0–12.0)
Neutro Abs: 2.9 10*3/uL (ref 1.4–7.7)
Neutrophils Relative %: 63.8 % (ref 43.0–77.0)
Platelets: 217 10*3/uL (ref 150.0–400.0)
RBC: 4.61 Mil/uL (ref 4.22–5.81)
RDW: 13.8 % (ref 11.5–15.5)
WBC: 4.6 10*3/uL (ref 4.0–10.5)

## 2021-03-22 LAB — BASIC METABOLIC PANEL
BUN: 23 mg/dL (ref 6–23)
CO2: 24 mEq/L (ref 19–32)
Calcium: 9.5 mg/dL (ref 8.4–10.5)
Chloride: 107 mEq/L (ref 96–112)
Creatinine, Ser: 1.33 mg/dL (ref 0.40–1.50)
GFR: 56.08 mL/min — ABNORMAL LOW (ref >60.00)
Glucose, Bld: 79 mg/dL (ref 70–99)
Potassium: 4.6 mEq/L (ref 3.5–5.1)
Sodium: 139 mEq/L (ref 135–145)

## 2021-03-22 LAB — LIPID PANEL
Cholesterol: 209 mg/dL — ABNORMAL HIGH (ref 0–200)
HDL: 52.2 mg/dL (ref >39.00)
LDL Cholesterol: 141 mg/dL — ABNORMAL HIGH (ref 0–99)
NonHDL: 156.59
Total CHOL/HDL Ratio: 4
Triglycerides: 77 mg/dL (ref 0.0–149.0)
VLDL: 15.4 mg/dL (ref 0.0–40.0)

## 2021-03-22 LAB — HEPATIC FUNCTION PANEL
ALT: 15 U/L (ref 0–53)
AST: 19 U/L (ref 0–37)
Albumin: 4.4 g/dL (ref 3.5–5.2)
Alkaline Phosphatase: 64 U/L (ref 39–117)
Bilirubin, Direct: 0.1 mg/dL (ref 0.0–0.3)
Total Bilirubin: 0.7 mg/dL (ref 0.2–1.2)
Total Protein: 7.2 g/dL (ref 6.0–8.3)

## 2021-03-22 LAB — TSH: TSH: 1.4 u[IU]/mL (ref 0.35–5.50)

## 2021-03-22 LAB — TESTOSTERONE: Testosterone: 279.05 ng/dL — ABNORMAL LOW (ref 300.00–890.00)

## 2021-03-22 NOTE — Progress Notes (Signed)
Established Patient Office Visit  Subjective:  Patient ID: [name], male    DOB: Sep 10, 1955  Age: 65 y.o. MRN: [id number]  CC:  Chief Complaint  Patient presents with   Annual Exam    No new concerns     HPI Jeff Ware presents for annual physical exam.  He has history of testicular cancer years ago.  He has history of hypogonadism and mild hyperlipidemia.  He and his wife live at the [facility] now for the most part.  They still have a home here in [place].  He is diligent with exercising daily  Health maintenance reviewed  -Had previous Zostavax and declines Shingrix. -Colonoscopy up-to-date -Needs Prevnar 20 -Tetanus up-to-date  Family history and social history reviewed with no significant changes.  He has 1 grandchild currently and a second on the way.  Past Medical History:  Diagnosis Date   Atypical nevi 09/23/2010   right low back (moderate)   Basal cell carcinoma 11/06/2019   right breast- (CX35FU+EXC)   Hyperlipidemia    SCC (squamous cell carcinoma) 11/06/2019   Left superior helix(CX35FU)   SCC (squamous cell carcinoma) 11/06/2019   right breast(EXC)   SCC (squamous cell carcinoma) 11/06/2019   right lower leg anterior   SCCA (squamous cell carcinoma) of skin 03/26/2014   Right Collarbone(in situ) (curet and 5FU)   SCCA (squamous cell carcinoma) of skin 05/08/2018   Right Ear Rim(in situ) (curet and 5FU)   Squamous cell carcinoma in situ (SCCIS) 03/26/2014   scc in situ Right collar bone   Squamous cell carcinoma in situ (SCCIS) 05/08/2018   Right ear rim   Testicular cancer ([place])     Past Surgical History:  Procedure Laterality Date   COLONOSCOPY     KNEE SURGERY     orchiectomy left  2006   POLYPECTOMY     ROTATOR CUFF REPAIR      Family History  Problem Relation Age of Onset   Heart disease Father    Seizures Father    Colon cancer Neg Hx    Colon polyps Neg Hx    Esophageal cancer Neg Hx    Rectal cancer Neg Hx    Stomach  cancer Neg Hx     Social History   Socioeconomic History   Marital status: Married    Spouse name: Dereion Jarquin   Number of children: Not on file   Years of education: Not on file   Highest education level: Not on file  Occupational History   Not on file  Tobacco Use   Smoking status: Never   Smokeless tobacco: Never  Vaping Use   Vaping Use: Never used  Substance and Sexual Activity   Alcohol use: No   Drug use: No   Sexual activity: Yes  Other Topics Concern   Not on file  Social History Narrative   Not on file   Social Determinants of Health   Financial Resource Strain: Not on file  Food Insecurity: Not on file  Transportation Needs: Not on file  Physical Activity: Not on file  Stress: Not on file  Social Connections: Not on file  Intimate Partner Violence: Not on file    Outpatient Medications Prior to Visit  Medication Sig Dispense Refill   aspirin 81 MG tablet Take 81 mg by mouth daily.     EPINEPHrine 0.3 mg/0.3 mL IJ SOAJ injection Inject into the muscle once.     fish oil-omega-3 fatty acids 1000 MG capsule Take  2 g by mouth 2 (two) times daily.     multivitamin (THERAGRAN) per tablet Take 1 tablet by mouth daily.     NONFORMULARY OR COMPOUNDED ITEM Testosterone 5% apply 43m daily as directed.  Disp 326m 5 refill. 30 each 5   Red Yeast Rice 600 MG TABS Take 600 mg by mouth daily.     No facility-administered medications prior to visit.    Allergies  Allergen Reactions   Bee Venom Other (See Comments)    bp drops and starts sweating     ROS Review of Systems  Constitutional:  Negative for activity change, appetite change, fatigue and fever.  HENT:  Negative for congestion, ear pain and trouble swallowing.   Eyes:  Negative for pain and visual disturbance.  Respiratory:  Negative for cough, shortness of breath and wheezing.   Cardiovascular:  Negative for chest pain and palpitations.  Gastrointestinal:  Negative for abdominal distention,  abdominal pain, blood in stool, constipation, diarrhea, nausea, rectal pain and vomiting.  Endocrine: Negative for polydipsia and polyuria.  Genitourinary:  Negative for dysuria, hematuria and testicular pain.  Musculoskeletal:  Negative for arthralgias and joint swelling.  Skin:  Negative for rash.  Neurological:  Negative for dizziness, syncope and headaches.  Hematological:  Negative for adenopathy.  Psychiatric/Behavioral:  Negative for confusion and dysphoric mood.      Objective:    Physical Exam Constitutional:      General: He is not in acute distress.    Appearance: He is well-developed.  HENT:     Head: Normocephalic and atraumatic.     Right Ear: External ear normal.     Left Ear: External ear normal.  Eyes:     Conjunctiva/sclera: Conjunctivae normal.     Pupils: Pupils are equal, round, and reactive to light.  Neck:     Thyroid: No thyromegaly.  Cardiovascular:     Rate and Rhythm: Normal rate and regular rhythm.     Heart sounds: Normal heart sounds. No murmur heard. Pulmonary:     Effort: No respiratory distress.     Breath sounds: No wheezing or rales.  Abdominal:     General: Bowel sounds are normal. There is no distension.     Palpations: Abdomen is soft. There is no mass.     Tenderness: There is no abdominal tenderness. There is no guarding or rebound.  Musculoskeletal:     Cervical back: Normal range of motion and neck supple.     Right lower leg: No edema.     Left lower leg: No edema.  Lymphadenopathy:     Cervical: No cervical adenopathy.  Skin:    Findings: No rash.  Neurological:     Mental Status: He is alert and oriented to person, place, and time.     Cranial Nerves: No cranial nerve deficit.    BP 122/64 (BP Location: Left Arm, Patient Position: Sitting, Cuff Size: Normal)   Pulse (!) 55   Ht 5' 8.5" (1.74 m)   Wt 162 lb 8 oz (73.7 kg)   SpO2 98%   BMI 24.35 kg/m  Wt Readings from Last 3 Encounters:  03/22/21 162 lb 8 oz (73.7 kg)   03/16/20 165 lb (74.8 kg)  02/25/19 164 lb 6.4 oz (74.6 kg)     Health Maintenance Due  Topic Date Due   COVID-19 Vaccine (1) Never done   HIV Screening  Never done   Zoster Vaccines- Shingrix (1 of 2) Never done   INFLUENZA VACCINE  02/01/2021    There are no preventive care reminders to display for this patient.  Lab Results  Component Value Date   TSH 0.84 03/16/2020   Lab Results  Component Value Date   WBC 8.6 03/16/2020   HGB 15.0 03/16/2020   HCT 43.3 03/16/2020   MCV 92.9 03/16/2020   PLT 222 03/16/2020   Lab Results  Component Value Date   NA 141 03/16/2020   K 4.3 03/16/2020   CO2 27 03/16/2020   GLUCOSE 83 03/16/2020   BUN 24 03/16/2020   CREATININE 1.21 03/16/2020   BILITOT 0.6 03/16/2020   ALKPHOS 65 02/25/2019   AST 19 03/16/2020   ALT 15 03/16/2020   PROT 7.2 03/16/2020   ALBUMIN 4.4 02/25/2019   CALCIUM 9.6 03/16/2020   GFR 76.23 02/25/2019   Lab Results  Component Value Date   CHOL 198 03/16/2020   Lab Results  Component Value Date   HDL 55 03/16/2020   Lab Results  Component Value Date   LDLCALC 129 (H) 03/16/2020   Lab Results  Component Value Date   TRIG 50 03/16/2020   Lab Results  Component Value Date   CHOLHDL 3.6 03/16/2020   No results found for: HGBA1C    Assessment & Plan:   Problem List Items Addressed This Visit   None Visit Diagnoses     Physical exam    -  Primary   Relevant Orders   Basic metabolic panel   Lipid panel   CBC with Differential/Platelet   TSH   Hepatic function panel   PSA   Testosterone     Healthy 65 year old male.  Remote history of testicular cancer.  He has history of hypogonadism.  On topical replacement for testosterone.  -Obtain screening labs as above -Discussed Shingrix vaccine and he will consider -Prevnar 20 vaccine given -Continue regular exercise habits -Next colonoscopy due 2028  No orders of the defined types were placed in this encounter.   Follow-up: No  follow-ups on file.    Carolann Littler, MD

## 2021-04-19 ENCOUNTER — Ambulatory Visit: Payer: Medicare Other

## 2021-04-22 ENCOUNTER — Other Ambulatory Visit: Payer: Self-pay

## 2021-04-22 ENCOUNTER — Ambulatory Visit (INDEPENDENT_AMBULATORY_CARE_PROVIDER_SITE_OTHER): Payer: Medicare Other | Admitting: *Deleted

## 2021-04-22 DIAGNOSIS — T63441D Toxic effect of venom of bees, accidental (unintentional), subsequent encounter: Secondary | ICD-10-CM | POA: Diagnosis not present

## 2021-06-04 ENCOUNTER — Telehealth: Payer: Self-pay | Admitting: Family Medicine

## 2021-06-04 MED ORDER — NONFORMULARY OR COMPOUNDED ITEM: 5 refills | Status: DC

## 2021-06-04 NOTE — Telephone Encounter (Signed)
This was not denied by me.  I spoke with patient and we are faxing over his prescription.

## 2021-06-04 NOTE — Telephone Encounter (Signed)
Pt is calling and would like to know the reason why testosterone was denied pt said [facility] and [facility] told him md denied medication. Pt would like a refill send to   [facility], [place] Phone:  [phone]  Fax:  [phone]

## 2021-06-21 ENCOUNTER — Other Ambulatory Visit: Payer: Self-pay

## 2021-06-21 ENCOUNTER — Ambulatory Visit (INDEPENDENT_AMBULATORY_CARE_PROVIDER_SITE_OTHER): Payer: Medicare Other

## 2021-06-21 ENCOUNTER — Other Ambulatory Visit: Payer: Medicare Other

## 2021-06-21 ENCOUNTER — Ambulatory Visit (INDEPENDENT_AMBULATORY_CARE_PROVIDER_SITE_OTHER): Payer: Medicare Other | Admitting: Family Medicine

## 2021-06-21 VITALS — BP 130/70 | HR 55 | Temp 97.6°F | Wt 166.1 lb

## 2021-06-21 DIAGNOSIS — R972 Elevated prostate specific antigen [PSA]: Secondary | ICD-10-CM | POA: Diagnosis not present

## 2021-06-21 DIAGNOSIS — T63441D Toxic effect of venom of bees, accidental (unintentional), subsequent encounter: Secondary | ICD-10-CM

## 2021-06-21 DIAGNOSIS — R197 Diarrhea, unspecified: Secondary | ICD-10-CM

## 2021-06-21 LAB — PSA: PSA: 3.12 ng/mL (ref 0.10–4.00)

## 2021-06-21 LAB — CBC WITH DIFFERENTIAL/PLATELET
Basophils Absolute: 0 10*3/uL (ref 0.0–0.1)
Basophils Relative: 0.7 % (ref 0.0–3.0)
Eosinophils Absolute: 0.1 10*3/uL (ref 0.0–0.7)
Eosinophils Relative: 1.2 % (ref 0.0–5.0)
HCT: 44.5 % (ref 39.0–52.0)
Hemoglobin: 15.4 g/dL (ref 13.0–17.0)
Lymphocytes Relative: 20.8 % (ref 12.0–46.0)
Lymphs Abs: 1.1 10*3/uL (ref 0.7–4.0)
MCHC: 34.6 g/dL (ref 30.0–36.0)
MCV: 92 fl (ref 78.0–100.0)
Monocytes Absolute: 0.6 10*3/uL (ref 0.1–1.0)
Monocytes Relative: 11.3 % (ref 3.0–12.0)
Neutro Abs: 3.4 10*3/uL (ref 1.4–7.7)
Neutrophils Relative %: 66 % (ref 43.0–77.0)
Platelets: 215 10*3/uL (ref 150.0–400.0)
RBC: 4.84 Mil/uL (ref 4.22–5.81)
RDW: 13.9 % (ref 11.5–15.5)
WBC: 5.2 10*3/uL (ref 4.0–10.5)

## 2021-06-21 LAB — COMPREHENSIVE METABOLIC PANEL
ALT: 17 U/L (ref 0–53)
AST: 21 U/L (ref 0–37)
Albumin: 4.2 g/dL (ref 3.5–5.2)
Alkaline Phosphatase: 70 U/L (ref 39–117)
BUN: 24 mg/dL — ABNORMAL HIGH (ref 6–23)
CO2: 27 mEq/L (ref 19–32)
Calcium: 9.5 mg/dL (ref 8.4–10.5)
Chloride: 104 mEq/L (ref 96–112)
Creatinine, Ser: 1.07 mg/dL (ref 0.40–1.50)
GFR: 72.68 mL/min (ref >60.00)
Glucose, Bld: 87 mg/dL (ref 70–99)
Potassium: 4.1 mEq/L (ref 3.5–5.1)
Sodium: 139 mEq/L (ref 135–145)
Total Bilirubin: 0.5 mg/dL (ref 0.2–1.2)
Total Protein: 7.2 g/dL (ref 6.0–8.3)

## 2021-06-21 LAB — TSH: TSH: 1.75 u[IU]/mL (ref 0.35–5.50)

## 2021-06-21 NOTE — Progress Notes (Signed)
Established Patient Office Visit  Subjective:  Patient ID: Jeff Ware, male    DOB: [birth year]  Age: 65 y.o. MRN: [id number]  CC:  Chief Complaint  Patient presents with   Diarrhea    Loose stool x 2 months. No other symp     HPI KEIGEN CADDELL presents for at least 34-month history of loose stools.  Only having about 1/day.  Not watery.  Nonbloody.  Last colonoscopy 10/18.  No history of lactose intolerance.  No history of gluten intolerance.  No appetite or weight changes.  Continues to exercise regularly.  He initially thought this may be due to a supplement called "athletic greens ".  He stopped the supplement couple weeks ago and symptoms have not changed.  No recent dietary changes.  No history of loose stools previously.  No history of cholecystectomy.  Recent albumin normal.  No abdominal pain or cramping.  He had recent labs with physical.  His PSA was 2.27 but this had increased from 1.16 a year prior.  Past Medical History:  Diagnosis Date   Atypical nevi 09/23/2010   right low back (moderate)   Basal cell carcinoma 11/06/2019   right breast- (CX35FU+EXC)   Hyperlipidemia    SCC (squamous cell carcinoma) 11/06/2019   Left superior helix(CX35FU)   SCC (squamous cell carcinoma) 11/06/2019   right breast(EXC)   SCC (squamous cell carcinoma) 11/06/2019   right lower leg anterior   SCCA (squamous cell carcinoma) of skin 03/26/2014   Right Collarbone(in situ) (curet and 5FU)   SCCA (squamous cell carcinoma) of skin 05/08/2018   Right Ear Rim(in situ) (curet and 5FU)   Squamous cell carcinoma in situ (SCCIS) 03/26/2014   scc in situ Right collar bone   Squamous cell carcinoma in situ (SCCIS) 05/08/2018   Right ear rim   Testicular cancer ([place])     Past Surgical History:  Procedure Laterality Date   COLONOSCOPY     KNEE SURGERY     orchiectomy left  2006   POLYPECTOMY     ROTATOR CUFF REPAIR      Family History  Problem Relation Age of Onset   Heart  disease Father    Seizures Father    Colon cancer Neg Hx    Colon polyps Neg Hx    Esophageal cancer Neg Hx    Rectal cancer Neg Hx    Stomach cancer Neg Hx     Social History   Socioeconomic History   Marital status: Married    Spouse name: Akeel Reffner   Number of children: Not on file   Years of education: Not on file   Highest education level: Not on file  Occupational History   Not on file  Tobacco Use   Smoking status: Never   Smokeless tobacco: Never  Vaping Use   Vaping Use: Never used  Substance and Sexual Activity   Alcohol use: No   Drug use: No   Sexual activity: Yes  Other Topics Concern   Not on file  Social History Narrative   Not on file   Social Determinants of Health   Financial Resource Strain: Not on file  Food Insecurity: Not on file  Transportation Needs: Not on file  Physical Activity: Not on file  Stress: Not on file  Social Connections: Not on file  Intimate Partner Violence: Not on file    Outpatient Medications Prior to Visit  Medication Sig Dispense Refill   aspirin 81 MG tablet Take 81  mg by mouth daily.     EPINEPHrine 0.3 mg/0.3 mL IJ SOAJ injection Inject into the muscle once.     fish oil-omega-3 fatty acids 1000 MG capsule Take 2 g by mouth 2 (two) times daily.     multivitamin (THERAGRAN) per tablet Take 1 tablet by mouth daily.     NONFORMULARY OR COMPOUNDED ITEM Testosterone 5% apply 85ml daily as directed.  Disp 38ml, 5 refill. 30 each 5   Red Yeast Rice 600 MG TABS Take 600 mg by mouth daily.     No facility-administered medications prior to visit.    Allergies  Allergen Reactions   Bee Venom Other (See Comments)    bp drops and starts sweating     ROS Review of Systems  Constitutional:  Negative for appetite change, fever and unexpected weight change.  Gastrointestinal:  Negative for abdominal pain, blood in stool, constipation, nausea and vomiting.     Objective:    Physical Exam Vitals reviewed.   Cardiovascular:     Rate and Rhythm: Normal rate and regular rhythm.  Pulmonary:     Effort: Pulmonary effort is normal.     Breath sounds: Normal breath sounds.  Abdominal:     General: Bowel sounds are normal.     Palpations: Abdomen is soft.     Tenderness: There is no abdominal tenderness.  Neurological:     Mental Status: He is alert.    BP 130/70 (BP Location: Left Arm, Patient Position: Sitting, Cuff Size: Normal)    Pulse (!) 55    Temp 97.6 F (36.4 C) (Oral)    Wt 166 lb 1.6 oz (75.3 kg)    SpO2 98%    BMI 24.89 kg/m  Wt Readings from Last 3 Encounters:  06/21/21 166 lb 1.6 oz (75.3 kg)  03/22/21 162 lb 8 oz (73.7 kg)  03/16/20 165 lb (74.8 kg)     Health Maintenance Due  Topic Date Due   COVID-19 Vaccine (1) Never done   HIV Screening  Never done   Zoster Vaccines- Shingrix (1 of 2) Never done   INFLUENZA VACCINE  02/01/2021    There are no preventive care reminders to display for this patient.  Lab Results  Component Value Date   TSH 1.40 03/22/2021   Lab Results  Component Value Date   WBC 4.6 03/22/2021   HGB 14.7 03/22/2021   HCT 42.4 03/22/2021   MCV 92.1 03/22/2021   PLT 217.0 03/22/2021   Lab Results  Component Value Date   NA 139 03/22/2021   K 4.6 03/22/2021   CO2 24 03/22/2021   GLUCOSE 79 03/22/2021   BUN 23 03/22/2021   CREATININE 1.33 03/22/2021   BILITOT 0.7 03/22/2021   ALKPHOS 64 03/22/2021   AST 19 03/22/2021   ALT 15 03/22/2021   PROT 7.2 03/22/2021   ALBUMIN 4.4 03/22/2021   CALCIUM 9.5 03/22/2021   GFR 56.08 (L) 03/22/2021   Lab Results  Component Value Date   CHOL 209 (H) 03/22/2021   Lab Results  Component Value Date   HDL 52.20 03/22/2021   Lab Results  Component Value Date   LDLCALC 141 (H) 03/22/2021   Lab Results  Component Value Date   TRIG 77.0 03/22/2021   Lab Results  Component Value Date   CHOLHDL 4 03/22/2021   No results found for: HGBA1C    Assessment & Plan:   #1 loose stools.  2  months duration.  Does not have any red flags  such as weight loss, bloody diarrhea, recent antibiotics, etc.  Doubt significant colitis with loose stools (not watery) and frequency of only 1 daily.  We explained this is also not typical of things like C. difficile colitis.  No history of lactose intolerance.  No known family history of gluten intolerance.  Colonoscopy up-to-date. -Recheck labs with CBC, CMP, TSH. -Low clinical suspicion for infectious etiology but given duration of symptoms will check GI pathogen panel. -Consider over-the-counter supplement of fiber such as FiberCon, Metamucil, or Citrucel -If not improving with fiber supplement be in touch  #2 PSA velocity.  Given increased from 1.16-2.27 recheck PSA today   No orders of the defined types were placed in this encounter.   Follow-up: No follow-ups on file.    Carolann Littler, MD

## 2021-06-21 NOTE — Patient Instructions (Signed)
Try the fiber supplement such as Citrucel, fibercon, or metamucil.

## 2021-06-23 ENCOUNTER — Encounter: Payer: Self-pay | Admitting: Family Medicine

## 2021-06-23 DIAGNOSIS — R972 Elevated prostate specific antigen [PSA]: Secondary | ICD-10-CM

## 2021-06-24 LAB — GI PROFILE, STOOL, PCR

## 2021-07-02 ENCOUNTER — Telehealth: Payer: Self-pay

## 2021-07-02 ENCOUNTER — Encounter: Payer: Self-pay | Admitting: Family Medicine

## 2021-07-02 DIAGNOSIS — R972 Elevated prostate specific antigen [PSA]: Secondary | ICD-10-CM

## 2021-07-02 NOTE — Telephone Encounter (Signed)
Patient called in for lab results.  He already has an appointment with Dr. Frazier [facility]. He would still like a referral placed for insurance. Referral has been placed.

## 2021-07-19 DIAGNOSIS — R972 Elevated prostate specific antigen [PSA]: Secondary | ICD-10-CM | POA: Diagnosis not present

## 2021-07-19 DIAGNOSIS — Z8547 Personal history of malignant neoplasm of testis: Secondary | ICD-10-CM | POA: Diagnosis not present

## 2021-07-19 DIAGNOSIS — N419 Inflammatory disease of prostate, unspecified: Secondary | ICD-10-CM | POA: Diagnosis not present

## 2021-08-11 DIAGNOSIS — Z8547 Personal history of malignant neoplasm of testis: Secondary | ICD-10-CM | POA: Diagnosis not present

## 2021-08-11 DIAGNOSIS — R972 Elevated prostate specific antigen [PSA]: Secondary | ICD-10-CM | POA: Diagnosis not present

## 2021-08-16 ENCOUNTER — Other Ambulatory Visit: Payer: Self-pay

## 2021-08-16 ENCOUNTER — Ambulatory Visit (INDEPENDENT_AMBULATORY_CARE_PROVIDER_SITE_OTHER): Payer: Medicare Other

## 2021-08-16 DIAGNOSIS — T63441D Toxic effect of venom of bees, accidental (unintentional), subsequent encounter: Secondary | ICD-10-CM | POA: Diagnosis not present

## 2021-08-19 DIAGNOSIS — N419 Inflammatory disease of prostate, unspecified: Secondary | ICD-10-CM | POA: Diagnosis not present

## 2021-08-19 DIAGNOSIS — R972 Elevated prostate specific antigen [PSA]: Secondary | ICD-10-CM | POA: Diagnosis not present

## 2021-08-19 DIAGNOSIS — Z8547 Personal history of malignant neoplasm of testis: Secondary | ICD-10-CM | POA: Diagnosis not present

## 2021-09-20 DIAGNOSIS — H3540 Unspecified peripheral retinal degeneration: Secondary | ICD-10-CM | POA: Diagnosis not present

## 2021-09-20 DIAGNOSIS — H524 Presbyopia: Secondary | ICD-10-CM | POA: Diagnosis not present

## 2021-09-22 DIAGNOSIS — M9903 Segmental and somatic dysfunction of lumbar region: Secondary | ICD-10-CM | POA: Diagnosis not present

## 2021-09-22 DIAGNOSIS — M5137 Other intervertebral disc degeneration, lumbosacral region: Secondary | ICD-10-CM | POA: Diagnosis not present

## 2021-10-05 ENCOUNTER — Ambulatory Visit (INDEPENDENT_AMBULATORY_CARE_PROVIDER_SITE_OTHER): Payer: Medicare Other

## 2021-10-05 VITALS — Ht 69.0 in | Wt 165.0 lb

## 2021-10-05 DIAGNOSIS — Z0000 Encounter for general adult medical examination without abnormal findings: Secondary | ICD-10-CM | POA: Diagnosis not present

## 2021-10-05 NOTE — Patient Instructions (Signed)
Jeff Ware , ?Thank you for taking time to come for your Medicare Wellness Visit. I appreciate your ongoing commitment to your health goals. Please review the following plan we discussed and let me know if I can assist you in the future.  ? ?Screening recommendations/referrals: ?Colonoscopy: completed 04/20/2017, due 04/21/2027 ?Recommended yearly ophthalmology/optometry visit for glaucoma screening and checkup ?Recommended yearly dental visit for hygiene and checkup ? ?Vaccinations: ?Influenza vaccine: due next flu season ?Pneumococcal vaccine: completed 03/22/2021 ?Tdap vaccine: completed 11/17/2011, due 11/16/2021 ?Shingles vaccine:  discussed   ?Covid-19:  states had 2 doses ? ?Advanced directives: Advance directive discussed with you today.  ? ?Conditions/risks identified: none ? ?Next appointment: Follow up in one year for your annual wellness visit.  ? ?Preventive Care 39 Years and Older, Male ?Preventive care refers to lifestyle choices and visits with your health care provider that can promote health and wellness. ?What does preventive care include? ?A yearly physical exam. This is also called an annual well check. ?Dental exams once or twice a year. ?Routine eye exams. Ask your health care provider how often you should have your eyes checked. ?Personal lifestyle choices, including: ?Daily care of your teeth and gums. ?Regular physical activity. ?Eating a healthy diet. ?Avoiding tobacco and drug use. ?Limiting alcohol use. ?Practicing safe sex. ?Taking low doses of aspirin every day. ?Taking vitamin and mineral supplements as recommended by your health care provider. ?What happens during an annual well check? ?The services and screenings done by your health care provider during your annual well check will depend on your age, overall health, lifestyle risk factors, and family history of disease. ?Counseling  ?Your health care provider may ask you questions about your: ?Alcohol use. ?Tobacco use. ?Drug  use. ?Emotional well-being. ?Home and relationship well-being. ?Sexual activity. ?Eating habits. ?History of falls. ?Memory and ability to understand (cognition). ?Work and work Statistician. ?Screening  ?You may have the following tests or measurements: ?Height, weight, and BMI. ?Blood pressure. ?Lipid and cholesterol levels. These may be checked every 5 years, or more frequently if you are over 79 years old. ?Skin check. ?Lung cancer screening. You may have this screening every year starting at age 61 if you have a 30-pack-year history of smoking and currently smoke or have quit within the past 15 years. ?Fecal occult blood test (FOBT) of the stool. You may have this test every year starting at age 41. ?Flexible sigmoidoscopy or colonoscopy. You may have a sigmoidoscopy every 5 years or a colonoscopy every 10 years starting at age 73. ?Prostate cancer screening. Recommendations will vary depending on your family history and other risks. ?Hepatitis C blood test. ?Hepatitis B blood test. ?Sexually transmitted disease (STD) testing. ?Diabetes screening. This is done by checking your blood sugar (glucose) after you have not eaten for a while (fasting). You may have this done every 1-3 years. ?Abdominal aortic aneurysm (AAA) screening. You may need this if you are a current or former smoker. ?Osteoporosis. You may be screened starting at age 30 if you are at high risk. ?Talk with your health care provider about your test results, treatment options, and if necessary, the need for more tests. ?Vaccines  ?Your health care provider may recommend certain vaccines, such as: ?Influenza vaccine. This is recommended every year. ?Tetanus, diphtheria, and acellular pertussis (Tdap, Td) vaccine. You may need a Td booster every 10 years. ?Zoster vaccine. You may need this after age 69. ?Pneumococcal 13-valent conjugate (PCV13) vaccine. One dose is recommended after age 55. ?Pneumococcal polysaccharide (  PPSV23) vaccine. One dose is  recommended after age 28. ?Talk to your health care provider about which screenings and vaccines you need and how often you need them. ?This information is not intended to replace advice given to you by your health care provider. Make sure you discuss any questions you have with your health care provider. ?Document Released: 07/17/2015 Document Revised: 03/09/2016 Document Reviewed: 04/21/2015 ?Elsevier Interactive Patient Education ? 2017 [place]. ? ?Fall Prevention in the Home ?Falls can cause injuries. They can happen to people of all ages. There are many things you can do to make your home safe and to help prevent falls. ?What can I do on the outside of my home? ?Regularly fix the edges of walkways and driveways and fix any cracks. ?Remove anything that might make you trip as you walk through a door, such as a raised step or threshold. ?Trim any bushes or trees on the path to your home. ?Use bright outdoor lighting. ?Clear any walking paths of anything that might make someone trip, such as rocks or tools. ?Regularly check to see if handrails are loose or broken. Make sure that both sides of any steps have handrails. ?Any raised decks and porches should have guardrails on the edges. ?Have any leaves, snow, or ice cleared regularly. ?Use sand or salt on walking paths during winter. ?Clean up any spills in your garage right away. This includes oil or grease spills. ?What can I do in the bathroom? ?Use night lights. ?Install grab bars by the toilet and in the tub and shower. Do not use towel bars as grab bars. ?Use non-skid mats or decals in the tub or shower. ?If you need to sit down in the shower, use a plastic, non-slip stool. ?Keep the floor dry. Clean up any water that spills on the floor as soon as it happens. ?Remove soap buildup in the tub or shower regularly. ?Attach bath mats securely with double-sided non-slip rug tape. ?Do not have throw rugs and other things on the floor that can make you  trip. ?What can I do in the bedroom? ?Use night lights. ?Make sure that you have a light by your bed that is easy to reach. ?Do not use any sheets or blankets that are too big for your bed. They should not hang down onto the floor. ?Have a firm chair that has side arms. You can use this for support while you get dressed. ?Do not have throw rugs and other things on the floor that can make you trip. ?What can I do in the kitchen? ?Clean up any spills right away. ?Avoid walking on wet floors. ?Keep items that you use a lot in easy-to-reach places. ?If you need to reach something above you, use a strong step stool that has a grab bar. ?Keep electrical cords out of the way. ?Do not use floor polish or wax that makes floors slippery. If you must use wax, use non-skid floor wax. ?Do not have throw rugs and other things on the floor that can make you trip. ?What can I do with my stairs? ?Do not leave any items on the stairs. ?Make sure that there are handrails on both sides of the stairs and use them. Fix handrails that are broken or loose. Make sure that handrails are as long as the stairways. ?Check any carpeting to make sure that it is firmly attached to the stairs. Fix any carpet that is loose or worn. ?Avoid having throw rugs at the top or  bottom of the stairs. If you do have throw rugs, attach them to the floor with carpet tape. ?Make sure that you have a light switch at the top of the stairs and the bottom of the stairs. If you do not have them, ask someone to add them for you. ?What else can I do to help prevent falls? ?Wear shoes that: ?Do not have high heels. ?Have rubber bottoms. ?Are comfortable and fit you well. ?Are closed at the toe. Do not wear sandals. ?If you use a stepladder: ?Make sure that it is fully opened. Do not climb a closed stepladder. ?Make sure that both sides of the stepladder are locked into place. ?Ask someone to hold it for you, if possible. ?Clearly mark and make sure that you can  see: ?Any grab bars or handrails. ?First and last steps. ?Where the edge of each step is. ?Use tools that help you move around (mobility aids) if they are needed. These include: ?Canes. ?Walkers. ?Scooters. ?Crutches. ?Turn on

## 2021-10-05 NOTE — Progress Notes (Signed)
?I connected with Jeff Ware today by telephone and verified that I am speaking with the correct person using two identifiers. ?Location patient: home ?Location provider: work ?Persons participating in the virtual visit: Khale, Nigh LPN. ?  ?I discussed the limitations, risks, security and privacy concerns of performing an evaluation and management service by telephone and the availability of in person appointments. I also discussed with the patient that there may be a patient responsible charge related to this service. The patient expressed understanding and verbally consented to this telephonic visit.  ?  ?Interactive audio and video telecommunications were attempted between this provider and patient, however failed, due to patient having technical difficulties OR patient did not have access to video capability.  We continued and completed visit with audio only. ? ?  ? ?Vital signs may be patient reported or missing. ? ?Subjective:  ? Jeff Ware is a 66 y.o. male who presents for an Initial Medicare Annual Wellness Visit. ? ?Review of Systems    ? ?Cardiac Risk Factors include: advanced age (>72mn, >>34women);dyslipidemia;male gender ? ?   ?Objective:  ?  ?Today's Vitals  ? 10/05/21 1507  ?Weight: 165 lb (74.8 kg)  ?Height: '5\' 9"'$  (1.753 m)  ? ?Body mass index is 24.37 kg/m?. ? ? ?  10/05/2021  ?  3:15 PM 04/06/2017  ?  8:55 AM 06/13/2015  ?  7:41 PM  ?Advanced Directives  ?Does Patient Have a Medical Advance Directive? No No No  ?Would patient like information on creating a medical advance directive?   No - patient declined information  ? ? ?Current Medications (verified) ?Outpatient Encounter Medications as of 10/05/2021  ?Medication Sig  ? EPINEPHrine 0.3 mg/0.3 mL IJ SOAJ injection Inject into the muscle once.  ? fish oil-omega-3 fatty acids 1000 MG capsule Take 2 g by mouth 2 (two) times daily.  ? multivitamin (THERAGRAN) per tablet Take 1 tablet by mouth daily.  ? NONFORMULARY OR  COMPOUNDED ITEM Testosterone 5% apply 113mdaily as directed.  Disp 304m5 refill.  ? Red Yeast Rice 600 MG TABS Take 600 mg by mouth daily.  ? aspirin 81 MG tablet Take 81 mg by mouth daily. (Patient not taking: Reported on 10/05/2021)  ? ?No facility-administered encounter medications on file as of 10/05/2021.  ? ? ?Allergies (verified) ?Bee venom  ? ?History: ?Past Medical History:  ?Diagnosis Date  ? Atypical nevi 09/23/2010  ? right low back (moderate)  ? Basal cell carcinoma 11/06/2019  ? right breast- (CX35FU+EXC)  ? Hyperlipidemia   ? SCC (squamous cell carcinoma) 11/06/2019  ? Left superior helix(CX35FU)  ? SCC (squamous cell carcinoma) 11/06/2019  ? right breast(EXC)  ? SCC (squamous cell carcinoma) 11/06/2019  ? right lower leg anterior  ? SCCA (squamous cell carcinoma) of skin 03/26/2014  ? Right Collarbone(in situ) (curet and 5FU)  ? SCCA (squamous cell carcinoma) of skin 05/08/2018  ? Right Ear Rim(in situ) (curet and 5FU)  ? Squamous cell carcinoma in situ (SCCIS) 03/26/2014  ? scc in situ Right collar bone  ? Squamous cell carcinoma in situ (SCCIS) 05/08/2018  ? Right ear rim  ? Testicular cancer ([facility] ? ?Past Surgical History:  ?Procedure Laterality Date  ? COLONOSCOPY    ? KNEE SURGERY    ? orchiectomy left  2006  ? POLYPECTOMY    ? ROTATOR CUFF REPAIR    ? ?Family History  ?Problem Relation Age of Onset  ? Heart disease Father   ?  Seizures Father   ? Colon cancer Neg Hx   ? Colon polyps Neg Hx   ? Esophageal cancer Neg Hx   ? Rectal cancer Neg Hx   ? Stomach cancer Neg Hx   ? ?Social History  ? ?Socioeconomic History  ? Marital status: Married  ?  Spouse name: Jeff Ware  ? Number of children: Not on file  ? Years of education: Not on file  ? Highest education level: Not on file  ?Occupational History  ? Not on file  ?Tobacco Use  ? Smoking status: Never  ? Smokeless tobacco: Never  ?Vaping Use  ? Vaping Use: Never used  ?Substance and Sexual Activity  ? Alcohol use: No  ? Drug use: No  ? Sexual  activity: Yes  ?Other Topics Concern  ? Not on file  ?Social History Narrative  ? Not on file  ? ?Social Determinants of Health  ? ?Financial Resource Strain: Low Risk   ? Difficulty of Paying Living Expenses: Not hard at all  ?Food Insecurity: No Food Insecurity  ? Worried About Charity fundraiser in the Last Year: Never true  ? Ran Out of Food in the Last Year: Never true  ?Transportation Needs: No Transportation Needs  ? Lack of Transportation (Medical): No  ? Lack of Transportation (Non-Medical): No  ?Physical Activity: Sufficiently Active  ? Days of Exercise per Week: 7 days  ? Minutes of Exercise per Session: 50 min  ?Stress: No Stress Concern Present  ? Feeling of Stress : Not at all  ?Social Connections: Not on file  ? ? ?Tobacco Counseling ?Counseling given: Not Answered ? ? ?Clinical Intake: ? ?Pre-visit preparation completed: Yes ? ?Pain : No/denies pain ? ?  ? ?Nutritional Status: BMI of 19-24  Normal ?Nutritional Risks: None ?Diabetes: No ? ?How often do you need to have someone help you when you read instructions, pamphlets, or other written materials from your doctor or pharmacy?: 1 - Never ?What is the last grade level you completed in school?: college ? ?Diabetic? no ? ?Interpreter Needed?: No ? ?Information entered by :: NAllen LPN ? ? ?Activities of Daily Living ? ?  10/05/2021  ?  3:17 PM  ?In your present state of health, do you have any difficulty performing the following activities:  ?Hearing? 0  ?Vision? 0  ?Difficulty concentrating or making decisions? 0  ?Walking or climbing stairs? 0  ?Dressing or bathing? 0  ?Doing errands, shopping? 0  ?Preparing Food and eating ? N  ?Using the Toilet? N  ?In the past six months, have you accidently leaked urine? N  ?Do you have problems with loss of bowel control? N  ?Managing your Medications? N  ?Managing your Finances? N  ?Housekeeping or managing your Housekeeping? N  ? ? ?Patient Care Team: ?Eulas Post, MD as PCP - General (Family  Medicine) ?Lavonna Monarch, MD as Consulting Physician (Dermatology) ? ?Indicate any recent Medical Services you may have received from other than [facility] providers in the past year (date may be approximate). ? ?   ?Assessment:  ? This is a routine wellness examination for Jeff Ware. ? ?Hearing/Vision screen ?Vision Screening - Comments:: No regular eye exams, [facility] ? ?Dietary issues and exercise activities discussed: ?Current Exercise Habits: Home exercise routine, Type of exercise: Other - see comments (running, biking), Time (Minutes): 45, Frequency (Times/Week): 7, Weekly Exercise (Minutes/Week): 315 ? ? Goals Addressed   ? ?  ?  ?  ?  ?  This Visit's Progress  ?  Patient Stated     ?  10/05/2021, try to maintain exercise regimen ?  ? ?  ? ?Depression Screen ? ?  10/05/2021  ?  3:17 PM 06/21/2021  ? 10:19 AM 02/25/2019  ?  2:14 PM 02/14/2018  ?  7:09 AM  ?PHQ 2/9 Scores  ?PHQ - 2 Score 0 0 0 0  ?PHQ- 9 Score   0   ?  ?Fall Risk ? ?  10/05/2021  ?  3:16 PM 02/14/2018  ?  7:09 AM  ?Fall Risk   ?Falls in the past year? 0 No  ?Number falls in past yr: 0   ?Injury with Fall? 0   ?Risk for fall due to : No Fall Risks   ?Follow up Falls evaluation completed;Education provided;Falls prevention discussed   ? ? ?FALL RISK PREVENTION PERTAINING TO THE HOME: ? ?Any stairs in or around the home? Yes  ?If so, are there any without handrails? No  ?Home free of loose throw rugs in walkways, pet beds, electrical cords, etc? Yes  ?Adequate lighting in your home to reduce risk of falls? Yes  ? ?ASSISTIVE DEVICES UTILIZED TO PREVENT FALLS: ? ?Life alert? No  ?Use of a cane, walker or w/c? No  ?Grab bars in the bathroom? No  ?Shower chair or bench in shower? No  ?Elevated toilet seat or a handicapped toilet? No  ? ?TIMED UP AND GO: ? ?Was the test performed? No .  ? ? ? ? ?Cognitive Function: ?  ?  ? ?  10/05/2021  ?  3:18 PM  ?6CIT Screen  ?What Year? 0 points  ?What month? 0 points  ?What time? 0 points  ?Count back from 20 0  points  ?Months in reverse 0 points  ?Repeat phrase 2 points  ?Total Score 2 points  ? ? ?Immunizations ?Immunization History  ?Administered Date(s) Administered  ? Influenza,inj,Quad PF,6+ Mos 03/16/2020  ? PNEUMOCOCCAL

## 2021-10-11 ENCOUNTER — Ambulatory Visit: Payer: Medicare Other

## 2021-10-18 ENCOUNTER — Ambulatory Visit (INDEPENDENT_AMBULATORY_CARE_PROVIDER_SITE_OTHER): Payer: Medicare Other

## 2021-10-18 DIAGNOSIS — T63441D Toxic effect of venom of bees, accidental (unintentional), subsequent encounter: Secondary | ICD-10-CM | POA: Diagnosis not present

## 2021-11-22 ENCOUNTER — Telehealth: Payer: Self-pay | Admitting: Family Medicine

## 2021-11-22 MED ORDER — NONFORMULARY OR COMPOUNDED ITEM: 5 refills | Status: DC

## 2021-11-22 NOTE — Telephone Encounter (Signed)
Pt requesting a refill of Testosterone 5% apply 62m daily as directed.  Disp 345m 5 refill.  [facility]:  [phone]:  [phone]

## 2021-11-22 NOTE — Telephone Encounter (Signed)
I printed out prescription.   We had difficulty in the past sending this electronically and see if we can fax this to their pharmacy.

## 2021-11-23 NOTE — Telephone Encounter (Signed)
Rx faxed

## 2021-12-13 ENCOUNTER — Ambulatory Visit: Payer: Medicare Other

## 2021-12-24 ENCOUNTER — Ambulatory Visit (INDEPENDENT_AMBULATORY_CARE_PROVIDER_SITE_OTHER): Payer: Medicare Other

## 2021-12-24 DIAGNOSIS — T63441D Toxic effect of venom of bees, accidental (unintentional), subsequent encounter: Secondary | ICD-10-CM | POA: Diagnosis not present

## 2022-02-02 DIAGNOSIS — R7989 Other specified abnormal findings of blood chemistry: Secondary | ICD-10-CM | POA: Diagnosis not present

## 2022-02-02 DIAGNOSIS — C6290 Malignant neoplasm of unspecified testis, unspecified whether descended or undescended: Secondary | ICD-10-CM | POA: Diagnosis not present

## 2022-02-02 DIAGNOSIS — L989 Disorder of the skin and subcutaneous tissue, unspecified: Secondary | ICD-10-CM | POA: Diagnosis not present

## 2022-02-02 DIAGNOSIS — E785 Hyperlipidemia, unspecified: Secondary | ICD-10-CM | POA: Diagnosis not present

## 2022-02-08 DIAGNOSIS — T782XXD Anaphylactic shock, unspecified, subsequent encounter: Secondary | ICD-10-CM | POA: Diagnosis not present

## 2022-02-08 DIAGNOSIS — T63451D Toxic effect of venom of hornets, accidental (unintentional), subsequent encounter: Secondary | ICD-10-CM | POA: Diagnosis not present

## 2022-02-08 DIAGNOSIS — T63461D Toxic effect of venom of wasps, accidental (unintentional), subsequent encounter: Secondary | ICD-10-CM | POA: Diagnosis not present

## 2022-02-17 DIAGNOSIS — R972 Elevated prostate specific antigen [PSA]: Secondary | ICD-10-CM | POA: Diagnosis not present

## 2022-02-18 DIAGNOSIS — T63461D Toxic effect of venom of wasps, accidental (unintentional), subsequent encounter: Secondary | ICD-10-CM | POA: Diagnosis not present

## 2022-02-18 DIAGNOSIS — T63451D Toxic effect of venom of hornets, accidental (unintentional), subsequent encounter: Secondary | ICD-10-CM | POA: Diagnosis not present

## 2022-02-21 ENCOUNTER — Ambulatory Visit (INDEPENDENT_AMBULATORY_CARE_PROVIDER_SITE_OTHER): Payer: Medicare Other

## 2022-02-21 DIAGNOSIS — T63441D Toxic effect of venom of bees, accidental (unintentional), subsequent encounter: Secondary | ICD-10-CM | POA: Diagnosis not present

## 2022-02-24 DIAGNOSIS — R972 Elevated prostate specific antigen [PSA]: Secondary | ICD-10-CM | POA: Diagnosis not present

## 2022-02-24 DIAGNOSIS — Z8547 Personal history of malignant neoplasm of testis: Secondary | ICD-10-CM | POA: Diagnosis not present

## 2022-02-28 ENCOUNTER — Ambulatory Visit: Payer: Medicare Other | Admitting: Dermatology

## 2022-03-28 ENCOUNTER — Encounter: Payer: Self-pay | Admitting: Family Medicine

## 2022-03-28 ENCOUNTER — Ambulatory Visit (INDEPENDENT_AMBULATORY_CARE_PROVIDER_SITE_OTHER): Payer: Medicare Other | Admitting: Family Medicine

## 2022-03-28 VITALS — BP 118/70 | HR 53 | Temp 98.6°F | Ht 68.9 in | Wt 158.5 lb

## 2022-03-28 DIAGNOSIS — E785 Hyperlipidemia, unspecified: Secondary | ICD-10-CM

## 2022-03-28 DIAGNOSIS — R7989 Other specified abnormal findings of blood chemistry: Secondary | ICD-10-CM | POA: Diagnosis not present

## 2022-03-28 DIAGNOSIS — Z125 Encounter for screening for malignant neoplasm of prostate: Secondary | ICD-10-CM

## 2022-03-28 DIAGNOSIS — Z79899 Other long term (current) drug therapy: Secondary | ICD-10-CM | POA: Diagnosis not present

## 2022-03-28 DIAGNOSIS — Z0000 Encounter for general adult medical examination without abnormal findings: Secondary | ICD-10-CM

## 2022-03-28 LAB — CBC WITH DIFFERENTIAL/PLATELET
Basophils Absolute: 0 10*3/uL (ref 0.0–0.1)
Basophils Relative: 0.6 % (ref 0.0–3.0)
Eosinophils Absolute: 0 10*3/uL (ref 0.0–0.7)
Eosinophils Relative: 0.7 % (ref 0.0–5.0)
HCT: 43.4 % (ref 39.0–52.0)
Hemoglobin: 15 g/dL (ref 13.0–17.0)
Lymphocytes Relative: 24.8 % (ref 12.0–46.0)
Lymphs Abs: 1.6 10*3/uL (ref 0.7–4.0)
MCHC: 34.6 g/dL (ref 30.0–36.0)
MCV: 91.6 fl (ref 78.0–100.0)
Monocytes Absolute: 0.5 10*3/uL (ref 0.1–1.0)
Monocytes Relative: 8.3 % (ref 3.0–12.0)
Neutro Abs: 4.2 10*3/uL (ref 1.4–7.7)
Neutrophils Relative %: 65.6 % (ref 43.0–77.0)
Platelets: 210 10*3/uL (ref 150.0–400.0)
RBC: 4.73 Mil/uL (ref 4.22–5.81)
RDW: 14.2 % (ref 11.5–15.5)
WBC: 6.3 10*3/uL (ref 4.0–10.5)

## 2022-03-28 LAB — HEPATIC FUNCTION PANEL
ALT: 21 U/L (ref 0–53)
AST: 19 U/L (ref 0–37)
Albumin: 4.3 g/dL (ref 3.5–5.2)
Alkaline Phosphatase: 64 U/L (ref 39–117)
Bilirubin, Direct: 0.1 mg/dL (ref 0.0–0.3)
Total Bilirubin: 0.7 mg/dL (ref 0.2–1.2)
Total Protein: 7.2 g/dL (ref 6.0–8.3)

## 2022-03-28 LAB — BASIC METABOLIC PANEL
BUN: 24 mg/dL — ABNORMAL HIGH (ref 6–23)
CO2: 27 mEq/L (ref 19–32)
Calcium: 9.6 mg/dL (ref 8.4–10.5)
Chloride: 102 mEq/L (ref 96–112)
Creatinine, Ser: 1.18 mg/dL (ref 0.40–1.50)
GFR: 64.28 mL/min (ref >60.00)
Glucose, Bld: 87 mg/dL (ref 70–99)
Potassium: 4.8 mEq/L (ref 3.5–5.1)
Sodium: 138 mEq/L (ref 135–145)

## 2022-03-28 LAB — LIPID PANEL
Cholesterol: 231 mg/dL — ABNORMAL HIGH (ref 0–200)
HDL: 57.3 mg/dL (ref >39.00)
LDL Cholesterol: 161 mg/dL — ABNORMAL HIGH (ref 0–99)
NonHDL: 173.86
Total CHOL/HDL Ratio: 4
Triglycerides: 63 mg/dL (ref 0.0–149.0)
VLDL: 12.6 mg/dL (ref 0.0–40.0)

## 2022-03-28 LAB — TESTOSTERONE: Testosterone: 409.42 ng/dL (ref 300.00–890.00)

## 2022-03-28 LAB — TSH: TSH: 1.54 u[IU]/mL (ref 0.35–5.50)

## 2022-03-28 LAB — PSA: PSA: 1.92 ng/mL (ref 0.10–4.00)

## 2022-03-28 MED ORDER — TESTOSTERONE 20.25 MG/ACT (1.62%) TD GEL: TRANSDERMAL | 3 refills | Status: DC

## 2022-03-28 NOTE — Patient Instructions (Signed)
Consider Shingrix (shingles vaccine) and tetanus and can get both at pharmacy  Remember to get follow up testosterone lab in about 1-2 months  Let me know regarding sleep study and we are happy to refer.

## 2022-03-28 NOTE — Progress Notes (Signed)
Established Patient Office Visit  Subjective   Patient ID: TAITE SCHOEPPNER, male    DOB: [birth year]  Age: 66 y.o. MRN: [id number]  Chief Complaint  Patient presents with   Annual Exam    HPI   Jeff Ware is seen for annual physical exam.  He has history of testicular cancer years ago.  Hypogonadism and is on testosterone replacement therapy.  Mild hyperlipidemia.  Very health-conscious.  Exercises regularly.  Health maintenance reviewed  -Declines flu vaccine -Pneumonia vaccine complete -Prior hepatitis C screen negative -Colonoscopy due 2028 -Tetanus due now and he plans to get at pharmacy -Has had prior Zostavax but not Shingrix  Social history-married with 3 sons.  He has 3 grandchildren now.  Non-smoker.  No regular alcohol.  Retired Chief Strategy Officer.  L[facility] at [facility] of the [facility]  Family history-  Past Medical History:  Diagnosis Date   Atypical nevi 09/23/2010   right low back (moderate)   Basal cell carcinoma 11/06/2019   right breast- (CX35FU+EXC)   Hyperlipidemia    SCC (squamous cell carcinoma) 11/06/2019   Left superior helix(CX35FU)   SCC (squamous cell carcinoma) 11/06/2019   right breast(EXC)   SCC (squamous cell carcinoma) 11/06/2019   right lower leg anterior   SCCA (squamous cell carcinoma) of skin 03/26/2014   Right Collarbone(in situ) (curet and 5FU)   SCCA (squamous cell carcinoma) of skin 05/08/2018   Right Ear Rim(in situ) (curet and 5FU)   Squamous cell carcinoma in situ (SCCIS) 03/26/2014   scc in situ Right collar bone   Squamous cell carcinoma in situ (SCCIS) 05/08/2018   Right ear rim   Testicular cancer [facility])    Past Surgical History:  Procedure Laterality Date   COLONOSCOPY     KNEE SURGERY     orchiectomy left  2006   POLYPECTOMY     ROTATOR CUFF REPAIR      reports that he has never smoked. He has never used smokeless tobacco. He reports that he does not drink alcohol and does not use drugs. family history includes Heart  disease in his father; Seizures in his father. Allergies  Allergen Reactions   Bee Venom Other (See Comments)    bp drops and starts sweating      Review of Systems  Constitutional:  Negative for chills, fever, malaise/fatigue and weight loss.  HENT:  Negative for hearing loss.   Eyes:  Negative for blurred vision and double vision.  Respiratory:  Negative for cough and shortness of breath.   Cardiovascular:  Negative for chest pain, palpitations and leg swelling.  Gastrointestinal:  Negative for abdominal pain, blood in stool and constipation.       Does still have occasional loose stools  Genitourinary:  Negative for dysuria.  Skin:  Negative for rash.  Neurological:  Negative for dizziness, speech change, seizures, loss of consciousness and headaches.  Psychiatric/Behavioral:  Negative for depression.       Objective:     BP 118/70 (BP Location: Left Arm, Patient Position: Sitting, Cuff Size: Normal)   Pulse (!) 53   Temp 98.6 F (37 C) (Oral)   Ht 5' 8.9" (1.75 m)   Wt 158 lb 8 oz (71.9 kg)   SpO2 97%   BMI 23.48 kg/m    Physical Exam Vitals reviewed.  Constitutional:      General: He is not in acute distress.    Appearance: He is well-developed.  HENT:     Head: Normocephalic and atraumatic.  Right Ear: External ear normal.     Left Ear: External ear normal.  Eyes:     Conjunctiva/sclera: Conjunctivae normal.     Pupils: Pupils are equal, round, and reactive to light.  Neck:     Thyroid: No thyromegaly.  Cardiovascular:     Rate and Rhythm: Normal rate and regular rhythm.     Heart sounds: Normal heart sounds. No murmur heard. Pulmonary:     Effort: No respiratory distress.     Breath sounds: No wheezing or rales.  Abdominal:     General: Bowel sounds are normal. There is no distension.     Palpations: Abdomen is soft. There is no mass.     Tenderness: There is no abdominal tenderness. There is no guarding or rebound.  Musculoskeletal:      Cervical back: Normal range of motion and neck supple.     Right lower leg: No edema.     Left lower leg: No edema.  Lymphadenopathy:     Cervical: No cervical adenopathy.  Skin:    Findings: No rash.  Neurological:     Mental Status: He is alert and oriented to person, place, and time.     Cranial Nerves: No cranial nerve deficit.      No results found for any visits on 03/28/22.    The 10-year ASCVD risk score (Arnett DK, et al., 2019) is: 12%    Assessment & Plan:   Problem List Items Addressed This Visit   None Visit Diagnoses     Physical exam    -  Primary   Relevant Orders   Basic metabolic panel   Lipid panel   CBC with Differential/Platelet   TSH   Hepatic function panel   PSA   Low testosterone       Relevant Orders   Testosterone     -Needs follow-up labs as above -Patient would like to consider prescription for AndroGel pump spray.  Cost was prohibitive previously but now that he has Medicare he would like to try this again.  He is more recently gotten [facility]. -Discussed tetanus booster which he will get through pharmacy -Discussed Shingrix vaccine which he will get at pharmacy -Discussed flu vaccine and he declines -Continue regular exercise habits.  No follow-ups on file.    Carolann Littler, MD

## 2022-04-18 ENCOUNTER — Telehealth: Payer: Self-pay | Admitting: Allergy and Immunology

## 2022-04-18 ENCOUNTER — Ambulatory Visit: Payer: Medicare Other

## 2022-04-18 DIAGNOSIS — T63461D Toxic effect of venom of wasps, accidental (unintentional), subsequent encounter: Secondary | ICD-10-CM | POA: Diagnosis not present

## 2022-04-18 DIAGNOSIS — T63451D Toxic effect of venom of hornets, accidental (unintentional), subsequent encounter: Secondary | ICD-10-CM | POA: Diagnosis not present

## 2022-04-18 NOTE — Telephone Encounter (Signed)
Patient has moved and plans to continue venom injections in [facility]. Patient plans to call when he is in the area to sign paperwork and get venom information transferred to the new facility he plans to use.

## 2022-04-18 NOTE — Telephone Encounter (Signed)
Patient called stating he will no longer be taking Venom shots in the [place] office please advise

## 2022-04-21 DIAGNOSIS — M9903 Segmental and somatic dysfunction of lumbar region: Secondary | ICD-10-CM | POA: Diagnosis not present

## 2022-04-21 DIAGNOSIS — M9902 Segmental and somatic dysfunction of thoracic region: Secondary | ICD-10-CM | POA: Diagnosis not present

## 2022-04-21 DIAGNOSIS — M62830 Muscle spasm of back: Secondary | ICD-10-CM | POA: Diagnosis not present

## 2022-04-21 DIAGNOSIS — M9904 Segmental and somatic dysfunction of sacral region: Secondary | ICD-10-CM | POA: Diagnosis not present

## 2022-05-09 DIAGNOSIS — M9902 Segmental and somatic dysfunction of thoracic region: Secondary | ICD-10-CM | POA: Diagnosis not present

## 2022-05-09 DIAGNOSIS — M9905 Segmental and somatic dysfunction of pelvic region: Secondary | ICD-10-CM | POA: Diagnosis not present

## 2022-05-09 DIAGNOSIS — M9903 Segmental and somatic dysfunction of lumbar region: Secondary | ICD-10-CM | POA: Diagnosis not present

## 2022-05-09 DIAGNOSIS — M9904 Segmental and somatic dysfunction of sacral region: Secondary | ICD-10-CM | POA: Diagnosis not present

## 2022-05-23 DIAGNOSIS — M9905 Segmental and somatic dysfunction of pelvic region: Secondary | ICD-10-CM | POA: Diagnosis not present

## 2022-05-23 DIAGNOSIS — M9904 Segmental and somatic dysfunction of sacral region: Secondary | ICD-10-CM | POA: Diagnosis not present

## 2022-05-23 DIAGNOSIS — M9902 Segmental and somatic dysfunction of thoracic region: Secondary | ICD-10-CM | POA: Diagnosis not present

## 2022-05-23 DIAGNOSIS — M9903 Segmental and somatic dysfunction of lumbar region: Secondary | ICD-10-CM | POA: Diagnosis not present

## 2022-05-30 ENCOUNTER — Telehealth: Payer: Self-pay

## 2022-05-30 NOTE — Telephone Encounter (Signed)
Pt is needing refill on Testosterone 20.25 MG/ACT (1.62%) GEL  and would like this sent to   [place] [place]   Pt would like a call back to know if it is completed [phone]

## 2022-05-30 NOTE — Telephone Encounter (Signed)
Left a message for the pt to return my call.  

## 2022-05-31 MED ORDER — NONFORMULARY OR COMPOUNDED ITEM: 5 refills | Status: DC

## 2022-05-31 NOTE — Addendum Note (Signed)
Addended by: Eulas Post on: 05/31/2022 07:47 PM   Modules accepted: Orders

## 2022-05-31 NOTE — Telephone Encounter (Signed)
I spoke with the patient and he stated he would like compounded Test[facility]. Patient reported Androgel is more expensive than compounded prescription.

## 2022-05-31 NOTE — Telephone Encounter (Signed)
Pt is returning mykal call 

## 2022-05-31 NOTE — Addendum Note (Signed)
Addended by: Eulas Post on: 05/31/2022 07:44 PM   Modules accepted: Orders

## 2022-06-01 ENCOUNTER — Telehealth: Payer: Self-pay | Admitting: Family Medicine

## 2022-06-01 DIAGNOSIS — J029 Acute pharyngitis, unspecified: Secondary | ICD-10-CM | POA: Diagnosis not present

## 2022-06-01 DIAGNOSIS — D225 Melanocytic nevi of trunk: Secondary | ICD-10-CM | POA: Diagnosis not present

## 2022-06-01 DIAGNOSIS — Z0000 Encounter for general adult medical examination without abnormal findings: Secondary | ICD-10-CM | POA: Diagnosis not present

## 2022-06-01 DIAGNOSIS — L821 Other seborrheic keratosis: Secondary | ICD-10-CM | POA: Diagnosis not present

## 2022-06-01 DIAGNOSIS — L218 Other seborrheic dermatitis: Secondary | ICD-10-CM | POA: Diagnosis not present

## 2022-06-01 DIAGNOSIS — E785 Hyperlipidemia, unspecified: Secondary | ICD-10-CM | POA: Diagnosis not present

## 2022-06-01 DIAGNOSIS — L718 Other rosacea: Secondary | ICD-10-CM | POA: Diagnosis not present

## 2022-06-01 DIAGNOSIS — G4733 Obstructive sleep apnea (adult) (pediatric): Secondary | ICD-10-CM | POA: Diagnosis not present

## 2022-06-01 MED ORDER — NONFORMULARY OR COMPOUNDED ITEM: 5 refills | Status: DC

## 2022-06-01 NOTE — Addendum Note (Signed)
Addended by: Eulas Post on: 06/01/2022 01:24 PM   Modules accepted: Orders

## 2022-06-01 NOTE — Telephone Encounter (Signed)
Rx has [facility]

## 2022-06-01 NOTE — Telephone Encounter (Signed)
Pt called to FU on yesterday's refill request for the Testosterone.  Pt states he called pharmacy and was told it was not sent in.  Pt is asking for a call back.   [place], [place] Phone: [phone]      LOV:  03/28/22 = CPE

## 2022-06-01 NOTE — Telephone Encounter (Signed)
Patient aware.

## 2022-06-03 NOTE — Telephone Encounter (Signed)
Rx was faxed on 06/01/22 to [facility]

## 2022-06-08 DIAGNOSIS — M9903 Segmental and somatic dysfunction of lumbar region: Secondary | ICD-10-CM | POA: Diagnosis not present

## 2022-06-08 DIAGNOSIS — M9902 Segmental and somatic dysfunction of thoracic region: Secondary | ICD-10-CM | POA: Diagnosis not present

## 2022-06-08 DIAGNOSIS — M9904 Segmental and somatic dysfunction of sacral region: Secondary | ICD-10-CM | POA: Diagnosis not present

## 2022-06-08 DIAGNOSIS — M9905 Segmental and somatic dysfunction of pelvic region: Secondary | ICD-10-CM | POA: Diagnosis not present

## 2022-06-10 DIAGNOSIS — M25512 Pain in left shoulder: Secondary | ICD-10-CM | POA: Diagnosis not present

## 2022-06-10 DIAGNOSIS — M19012 Primary osteoarthritis, left shoulder: Secondary | ICD-10-CM | POA: Diagnosis not present

## 2022-06-10 DIAGNOSIS — M24012 Loose body in left shoulder: Secondary | ICD-10-CM | POA: Diagnosis not present

## 2022-06-13 DIAGNOSIS — T63451D Toxic effect of venom of hornets, accidental (unintentional), subsequent encounter: Secondary | ICD-10-CM | POA: Diagnosis not present

## 2022-06-13 DIAGNOSIS — T63461D Toxic effect of venom of wasps, accidental (unintentional), subsequent encounter: Secondary | ICD-10-CM | POA: Diagnosis not present

## 2022-06-14 ENCOUNTER — Telehealth: Payer: Self-pay | Admitting: Family Medicine

## 2022-06-14 DIAGNOSIS — M75102 Unspecified rotator cuff tear or rupture of left shoulder, not specified as traumatic: Secondary | ICD-10-CM | POA: Diagnosis not present

## 2022-06-14 DIAGNOSIS — M25512 Pain in left shoulder: Secondary | ICD-10-CM | POA: Diagnosis not present

## 2022-06-14 NOTE — Telephone Encounter (Signed)
Needs most recent labs sent to [phone]. Previous request requested all labs and only got the labs for 2012

## 2022-06-14 NOTE — Telephone Encounter (Signed)
Office notes faxed 

## 2022-06-17 DIAGNOSIS — M7542 Impingement syndrome of left shoulder: Secondary | ICD-10-CM | POA: Diagnosis not present

## 2022-06-17 DIAGNOSIS — M25512 Pain in left shoulder: Secondary | ICD-10-CM | POA: Diagnosis not present

## 2022-06-17 DIAGNOSIS — M6281 Muscle weakness (generalized): Secondary | ICD-10-CM | POA: Diagnosis not present

## 2022-06-17 DIAGNOSIS — M25612 Stiffness of left shoulder, not elsewhere classified: Secondary | ICD-10-CM | POA: Diagnosis not present

## 2022-06-21 DIAGNOSIS — M25612 Stiffness of left shoulder, not elsewhere classified: Secondary | ICD-10-CM | POA: Diagnosis not present

## 2022-06-21 DIAGNOSIS — M7542 Impingement syndrome of left shoulder: Secondary | ICD-10-CM | POA: Diagnosis not present

## 2022-06-21 DIAGNOSIS — M25512 Pain in left shoulder: Secondary | ICD-10-CM | POA: Diagnosis not present

## 2022-06-21 DIAGNOSIS — M6281 Muscle weakness (generalized): Secondary | ICD-10-CM | POA: Diagnosis not present

## 2022-06-23 DIAGNOSIS — M7542 Impingement syndrome of left shoulder: Secondary | ICD-10-CM | POA: Diagnosis not present

## 2022-06-23 DIAGNOSIS — M25612 Stiffness of left shoulder, not elsewhere classified: Secondary | ICD-10-CM | POA: Diagnosis not present

## 2022-06-23 DIAGNOSIS — M25512 Pain in left shoulder: Secondary | ICD-10-CM | POA: Diagnosis not present

## 2022-06-23 DIAGNOSIS — M6281 Muscle weakness (generalized): Secondary | ICD-10-CM | POA: Diagnosis not present

## 2022-06-29 DIAGNOSIS — M25512 Pain in left shoulder: Secondary | ICD-10-CM | POA: Diagnosis not present

## 2022-06-29 DIAGNOSIS — M6281 Muscle weakness (generalized): Secondary | ICD-10-CM | POA: Diagnosis not present

## 2022-06-29 DIAGNOSIS — M25612 Stiffness of left shoulder, not elsewhere classified: Secondary | ICD-10-CM | POA: Diagnosis not present

## 2022-06-29 DIAGNOSIS — M7542 Impingement syndrome of left shoulder: Secondary | ICD-10-CM | POA: Diagnosis not present

## 2022-07-01 DIAGNOSIS — M6281 Muscle weakness (generalized): Secondary | ICD-10-CM | POA: Diagnosis not present

## 2022-07-01 DIAGNOSIS — M7542 Impingement syndrome of left shoulder: Secondary | ICD-10-CM | POA: Diagnosis not present

## 2022-07-01 DIAGNOSIS — M25512 Pain in left shoulder: Secondary | ICD-10-CM | POA: Diagnosis not present

## 2022-07-01 DIAGNOSIS — M25612 Stiffness of left shoulder, not elsewhere classified: Secondary | ICD-10-CM | POA: Diagnosis not present

## 2022-07-05 DIAGNOSIS — M25612 Stiffness of left shoulder, not elsewhere classified: Secondary | ICD-10-CM | POA: Diagnosis not present

## 2022-07-05 DIAGNOSIS — M25512 Pain in left shoulder: Secondary | ICD-10-CM | POA: Diagnosis not present

## 2022-07-05 DIAGNOSIS — M7542 Impingement syndrome of left shoulder: Secondary | ICD-10-CM | POA: Diagnosis not present

## 2022-07-05 DIAGNOSIS — M6281 Muscle weakness (generalized): Secondary | ICD-10-CM | POA: Diagnosis not present

## 2022-07-07 DIAGNOSIS — M25612 Stiffness of left shoulder, not elsewhere classified: Secondary | ICD-10-CM | POA: Diagnosis not present

## 2022-07-07 DIAGNOSIS — M6281 Muscle weakness (generalized): Secondary | ICD-10-CM | POA: Diagnosis not present

## 2022-07-07 DIAGNOSIS — M7542 Impingement syndrome of left shoulder: Secondary | ICD-10-CM | POA: Diagnosis not present

## 2022-07-07 DIAGNOSIS — M25512 Pain in left shoulder: Secondary | ICD-10-CM | POA: Diagnosis not present

## 2022-07-08 DIAGNOSIS — M9902 Segmental and somatic dysfunction of thoracic region: Secondary | ICD-10-CM | POA: Diagnosis not present

## 2022-07-08 DIAGNOSIS — M9904 Segmental and somatic dysfunction of sacral region: Secondary | ICD-10-CM | POA: Diagnosis not present

## 2022-07-08 DIAGNOSIS — M9905 Segmental and somatic dysfunction of pelvic region: Secondary | ICD-10-CM | POA: Diagnosis not present

## 2022-07-08 DIAGNOSIS — M9903 Segmental and somatic dysfunction of lumbar region: Secondary | ICD-10-CM | POA: Diagnosis not present

## 2022-07-11 DIAGNOSIS — M25612 Stiffness of left shoulder, not elsewhere classified: Secondary | ICD-10-CM | POA: Diagnosis not present

## 2022-07-11 DIAGNOSIS — M7542 Impingement syndrome of left shoulder: Secondary | ICD-10-CM | POA: Diagnosis not present

## 2022-07-11 DIAGNOSIS — M25512 Pain in left shoulder: Secondary | ICD-10-CM | POA: Diagnosis not present

## 2022-07-11 DIAGNOSIS — M6281 Muscle weakness (generalized): Secondary | ICD-10-CM | POA: Diagnosis not present

## 2022-07-18 DIAGNOSIS — M25612 Stiffness of left shoulder, not elsewhere classified: Secondary | ICD-10-CM | POA: Diagnosis not present

## 2022-07-18 DIAGNOSIS — M25512 Pain in left shoulder: Secondary | ICD-10-CM | POA: Diagnosis not present

## 2022-07-18 DIAGNOSIS — M6281 Muscle weakness (generalized): Secondary | ICD-10-CM | POA: Diagnosis not present

## 2022-07-18 DIAGNOSIS — M7542 Impingement syndrome of left shoulder: Secondary | ICD-10-CM | POA: Diagnosis not present

## 2022-07-19 DIAGNOSIS — M75102 Unspecified rotator cuff tear or rupture of left shoulder, not specified as traumatic: Secondary | ICD-10-CM | POA: Diagnosis not present

## 2022-07-19 DIAGNOSIS — M25512 Pain in left shoulder: Secondary | ICD-10-CM | POA: Diagnosis not present

## 2022-07-19 DIAGNOSIS — Z8739 Personal history of other diseases of the musculoskeletal system and connective tissue: Secondary | ICD-10-CM | POA: Diagnosis not present

## 2022-07-19 DIAGNOSIS — M19019 Primary osteoarthritis, unspecified shoulder: Secondary | ICD-10-CM | POA: Diagnosis not present

## 2022-07-25 DIAGNOSIS — M25512 Pain in left shoulder: Secondary | ICD-10-CM | POA: Diagnosis not present

## 2022-07-25 DIAGNOSIS — Z8739 Personal history of other diseases of the musculoskeletal system and connective tissue: Secondary | ICD-10-CM | POA: Diagnosis not present

## 2022-07-25 DIAGNOSIS — M75102 Unspecified rotator cuff tear or rupture of left shoulder, not specified as traumatic: Secondary | ICD-10-CM | POA: Diagnosis not present

## 2022-07-29 DIAGNOSIS — M9902 Segmental and somatic dysfunction of thoracic region: Secondary | ICD-10-CM | POA: Diagnosis not present

## 2022-07-29 DIAGNOSIS — M9904 Segmental and somatic dysfunction of sacral region: Secondary | ICD-10-CM | POA: Diagnosis not present

## 2022-07-29 DIAGNOSIS — M9905 Segmental and somatic dysfunction of pelvic region: Secondary | ICD-10-CM | POA: Diagnosis not present

## 2022-07-29 DIAGNOSIS — M9903 Segmental and somatic dysfunction of lumbar region: Secondary | ICD-10-CM | POA: Diagnosis not present

## 2022-08-03 DIAGNOSIS — Z8739 Personal history of other diseases of the musculoskeletal system and connective tissue: Secondary | ICD-10-CM | POA: Diagnosis not present

## 2022-08-03 DIAGNOSIS — M75102 Unspecified rotator cuff tear or rupture of left shoulder, not specified as traumatic: Secondary | ICD-10-CM | POA: Diagnosis not present

## 2022-08-03 DIAGNOSIS — M19012 Primary osteoarthritis, left shoulder: Secondary | ICD-10-CM | POA: Diagnosis not present

## 2022-08-15 DIAGNOSIS — T63461D Toxic effect of venom of wasps, accidental (unintentional), subsequent encounter: Secondary | ICD-10-CM | POA: Diagnosis not present

## 2022-08-15 DIAGNOSIS — T63451D Toxic effect of venom of hornets, accidental (unintentional), subsequent encounter: Secondary | ICD-10-CM | POA: Diagnosis not present

## 2022-08-23 DIAGNOSIS — M9904 Segmental and somatic dysfunction of sacral region: Secondary | ICD-10-CM | POA: Diagnosis not present

## 2022-08-23 DIAGNOSIS — M9905 Segmental and somatic dysfunction of pelvic region: Secondary | ICD-10-CM | POA: Diagnosis not present

## 2022-08-23 DIAGNOSIS — M9903 Segmental and somatic dysfunction of lumbar region: Secondary | ICD-10-CM | POA: Diagnosis not present

## 2022-08-23 DIAGNOSIS — M9902 Segmental and somatic dysfunction of thoracic region: Secondary | ICD-10-CM | POA: Diagnosis not present

## 2022-08-31 DIAGNOSIS — M25512 Pain in left shoulder: Secondary | ICD-10-CM | POA: Diagnosis not present

## 2022-09-02 DIAGNOSIS — R0683 Snoring: Secondary | ICD-10-CM | POA: Diagnosis not present

## 2022-09-02 DIAGNOSIS — M25519 Pain in unspecified shoulder: Secondary | ICD-10-CM | POA: Diagnosis not present

## 2022-09-28 ENCOUNTER — Telehealth: Payer: Self-pay | Admitting: Family Medicine

## 2022-09-28 DIAGNOSIS — M9905 Segmental and somatic dysfunction of pelvic region: Secondary | ICD-10-CM | POA: Diagnosis not present

## 2022-09-28 DIAGNOSIS — M9902 Segmental and somatic dysfunction of thoracic region: Secondary | ICD-10-CM | POA: Diagnosis not present

## 2022-09-28 DIAGNOSIS — M9903 Segmental and somatic dysfunction of lumbar region: Secondary | ICD-10-CM | POA: Diagnosis not present

## 2022-09-28 DIAGNOSIS — M9904 Segmental and somatic dysfunction of sacral region: Secondary | ICD-10-CM | POA: Diagnosis not present

## 2022-09-28 NOTE — Telephone Encounter (Signed)
Move appt from 3pm to 10:30 Pt aware

## 2022-09-28 NOTE — Telephone Encounter (Signed)
Called patient to schedule Medicare Annual Wellness Visit (AWV). Left message for patient to call back and schedule Medicare Annual Wellness Visit (AWV).  Last date of AWV: 10/05/21  Left message asking pt to call [phone] due to schedule change 10/10/22 AWV appointment needs to be r/s   If any questions, please contact me at [phone].  Thank you ,  Barkley Boards AWV direct phone # [phone]

## 2022-10-10 ENCOUNTER — Ambulatory Visit (INDEPENDENT_AMBULATORY_CARE_PROVIDER_SITE_OTHER): Payer: Medicare Other

## 2022-10-10 ENCOUNTER — Ambulatory Visit: Payer: Medicare Other

## 2022-10-10 VITALS — Ht 68.9 in | Wt 158.0 lb

## 2022-10-10 DIAGNOSIS — Z0000 Encounter for general adult medical examination without abnormal findings: Secondary | ICD-10-CM

## 2022-10-10 NOTE — Patient Instructions (Addendum)
Jeff Ware , Thank you for taking time to come for your Medicare Wellness Visit. I appreciate your ongoing commitment to your health goals. Please review the following plan we discussed and let me know if I can assist you in the future.   These are the goals we discussed:  Goals      Patient Stated     10/05/2021, try to maintain exercise regimen        This is a list of the screening recommended for you and due dates:  Health Maintenance  Topic Date Due   DTaP/Tdap/Td vaccine (3 - Td or Tdap) 11/16/2021   COVID-19 Vaccine (1) 10/26/2022*   Zoster (Shingles) Vaccine (1 of 2) 01/09/2023*   Flu Shot  02/02/2023   Medicare Annual Wellness Visit  10/10/2023   Colon Cancer Screening  04/21/2027   Pneumonia Vaccine  Completed   Hepatitis C Screening: USPSTF Recommendation to screen - Ages 18-79 yo.  Completed   HPV Vaccine  Aged Out  *Topic was postponed. The date shown is not the original due date.    Advanced directives: Please bring a copy of your health care power of attorney and living will to the office to be added to your chart at your convenience.   Conditions/risks identified: None  Next appointment: Follow up in one year for your annual wellness visit.   Preventive Care 67 Years and Older, Male  Preventive care refers to lifestyle choices and visits with your health care provider that can promote health and wellness. What does preventive care include? A yearly physical exam. This is also called an annual well check. Dental exams once or twice a year. Routine eye exams. Ask your health care provider how often you should have your eyes checked. Personal lifestyle choices, including: Daily care of your teeth and gums. Regular physical activity. Eating a healthy diet. Avoiding tobacco and drug use. Limiting alcohol use. Practicing safe sex. Taking low doses of aspirin every day. Taking vitamin and mineral supplements as recommended by your health care provider. What  happens during an annual well check? The services and screenings done by your health care provider during your annual well check will depend on your age, overall health, lifestyle risk factors, and family history of disease. Counseling  Your health care provider may ask you questions about your: Alcohol use. Tobacco use. Drug use. Emotional well-being. Home and relationship well-being. Sexual activity. Eating habits. History of falls. Memory and ability to understand (cognition). Work and work Astronomer. Screening  You may have the following tests or measurements: Height, weight, and BMI. Blood pressure. Lipid and cholesterol levels. These may be checked every 5 years, or more frequently if you are over 67 years old. Skin check. Lung cancer screening. You may have this screening every year starting at age 67 if you have a 30-pack-year history of smoking and currently smoke or have quit within the past 15 years. Fecal occult blood test (FOBT) of the stool. You may have this test every year starting at age 67. Flexible sigmoidoscopy or colonoscopy. You may have a sigmoidoscopy every 5 years or a colonoscopy every 10 years starting at age 67. Prostate cancer screening. Recommendations will vary depending on your family history and other risks. Hepatitis C blood test. Hepatitis B blood test. Sexually transmitted disease (STD) testing. Diabetes screening. This is done by checking your blood sugar (glucose) after you have not eaten for a while (fasting). You may have this done every 1-3 years. Abdominal aortic aneurysm (  AAA) screening. You may need this if you are a current or former smoker. Osteoporosis. You may be screened starting at age 67 if you are at high risk. Talk with your health care provider about your test results, treatment options, and if necessary, the need for more tests. Vaccines  Your health care provider may recommend certain vaccines, such as: Influenza vaccine. This  is recommended every year. Tetanus, diphtheria, and acellular pertussis (Tdap, Td) vaccine. You may need a Td booster every 10 years. Zoster vaccine. You may need this after age 30. Pneumococcal 13-valent conjugate (PCV13) vaccine. One dose is recommended after age 67. Pneumococcal polysaccharide (PPSV23) vaccine. One dose is recommended after age 67. Talk to your health care provider about which screenings and vaccines you need and how often you need them. This information is not intended to replace advice given to you by your health care provider. Make sure you discuss any questions you have with your health care provider. Document Released: 07/17/2015 Document Revised: 03/09/2016 Document Reviewed: 04/21/2015 Elsevier Interactive Patient Education  2017 [place] Prevention in the Home Falls can cause injuries. They can happen to people of all ages. There are many things you can do to make your home safe and to help prevent falls. What can I do on the outside of my home? Regularly fix the edges of walkways and driveways and fix any cracks. Remove anything that might make you trip as you walk through a door, such as a raised step or threshold. Trim any bushes or trees on the path to your home. Use bright outdoor lighting. Clear any walking paths of anything that might make someone trip, such as rocks or tools. Regularly check to see if handrails are loose or broken. Make sure that both sides of any steps have handrails. Any raised decks and porches should have guardrails on the edges. Have any leaves, snow, or ice cleared regularly. Use sand or salt on walking paths during winter. Clean up any spills in your garage right away. This includes oil or grease spills. What can I do in the bathroom? Use night lights. Install grab bars by the toilet and in the tub and shower. Do not use towel bars as grab bars. Use non-skid mats or decals in the tub or shower. If you need to sit down  in the shower, use a plastic, non-slip stool. Keep the floor dry. Clean up any water that spills on the floor as soon as it happens. Remove soap buildup in the tub or shower regularly. Attach bath mats securely with double-sided non-slip rug tape. Do not have throw rugs and other things on the floor that can make you trip. What can I do in the bedroom? Use night lights. Make sure that you have a light by your bed that is easy to reach. Do not use any sheets or blankets that are too big for your bed. They should not hang down onto the floor. Have a firm chair that has side arms. You can use this for support while you get dressed. Do not have throw rugs and other things on the floor that can make you trip. What can I do in the kitchen? Clean up any spills right away. Avoid walking on wet floors. Keep items that you use a lot in easy-to-reach places. If you need to reach something above you, use a strong step stool that has a grab bar. Keep electrical cords out of the way. Do not use floor polish  or wax that makes floors slippery. If you must use wax, use non-skid floor wax. Do not have throw rugs and other things on the floor that can make you trip. What can I do with my stairs? Do not leave any items on the stairs. Make sure that there are handrails on both sides of the stairs and use them. Fix handrails that are broken or loose. Make sure that handrails are as long as the stairways. Check any carpeting to make sure that it is firmly attached to the stairs. Fix any carpet that is loose or worn. Avoid having throw rugs at the top or bottom of the stairs. If you do have throw rugs, attach them to the floor with carpet tape. Make sure that you have a light switch at the top of the stairs and the bottom of the stairs. If you do not have them, ask someone to add them for you. What else can I do to help prevent falls? Wear shoes that: Do not have high heels. Have rubber bottoms. Are comfortable  and fit you well. Are closed at the toe. Do not wear sandals. If you use a stepladder: Make sure that it is fully opened. Do not climb a closed stepladder. Make sure that both sides of the stepladder are locked into place. Ask someone to hold it for you, if possible. Clearly mark and make sure that you can see: Any grab bars or handrails. First and last steps. Where the edge of each step is. Use tools that help you move around (mobility aids) if they are needed. These include: Canes. Walkers. Scooters. Crutches. Turn on the lights when you go into a dark area. Replace any light bulbs as soon as they burn out. Set up your furniture so you have a clear path. Avoid moving your furniture around. If any of your floors are uneven, fix them. If there are any pets around you, be aware of where they are. Review your medicines with your doctor. Some medicines can make you feel dizzy. This can increase your chance of falling. Ask your doctor what other things that you can do to help prevent falls. This information is not intended to replace advice given to you by your health care provider. Make sure you discuss any questions you have with your health care provider. Document Released: 04/16/2009 Document Revised: 11/26/2015 Document Reviewed: 07/25/2014 Elsevier Interactive Patient Education  2017 [employer].

## 2022-10-10 NOTE — Progress Notes (Signed)
Subjective:   Jeff Ware is a 67 y.o. male who presents for Medicare Annual/Subsequent preventive examination.  Review of Systems    Virtual Visit via Telephone Note  I connected with  [name] on 10/10/22 at 10:30 AM EDT by telephone and verified that I am speaking with the correct person using two identifiers.  Location: Patient: Home Provider: Office Persons participating in the virtual visit: patient/Nurse Health Advisor   I discussed the limitations, risks, security and privacy concerns of performing an evaluation and management service by telephone and the availability of in person appointments. The patient expressed understanding and agreed to proceed.  Interactive audio and video telecommunications were attempted between this nurse and patient, however failed, due to patient having technical difficulties OR patient did not have access to video capability.  We continued and completed visit with audio only.  Some vital signs may be absent or patient reported.   [name], LPN  Cardiac Risk Factors include: advanced age (>41men, >44 women);male gender     Objective:    Today's Vitals   10/10/22 1042  Weight: 158 lb (71.7 kg)  Height: 5' 8.9" (1.75 m)   Body mass index is 23.4 kg/m.     10/10/2022   10:51 AM 10/05/2021    3:15 PM 04/06/2017    8:55 AM 06/13/2015    7:41 PM  Advanced Directives  Does Patient Have a Medical Advance Directive? Yes No No No  Type of Estate agent of [place];Living will     Copy of Healthcare Power of Attorney in Chart? No - copy requested     Would patient like information on creating a medical advance directive?    No - patient declined information    Current Medications (verified) Outpatient Encounter Medications as of 10/10/2022  Medication Sig   aspirin 81 MG tablet Take 81 mg by mouth daily. (Patient not taking: Reported on 10/05/2021)   EPINEPHrine 0.3 mg/0.3 mL IJ SOAJ injection Inject  into the muscle once.   fish oil-omega-3 fatty acids 1000 MG capsule Take 2 g by mouth 2 (two) times daily.   multivitamin (THERAGRAN) per tablet Take 1 tablet by mouth daily.   NONFORMULARY OR COMPOUNDED ITEM Testosterone 5% apply 31ml daily as directed.  Disp 77ml, 5 refill.Testosterone 5% apply 46ml daily as directed.  Disp 86ml, 5 refill.   Red Yeast Rice 600 MG TABS Take 600 mg by mouth daily.   No facility-administered encounter medications on file as of 10/10/2022.    Allergies (verified) Bee venom   History: Past Medical History:  Diagnosis Date   Atypical nevi 09/23/2010   right low back (moderate)   Basal cell carcinoma 11/06/2019   right breast- (CX35FU+EXC)   Hyperlipidemia    SCC (squamous cell carcinoma) 11/06/2019   Left superior helix(CX35FU)   SCC (squamous cell carcinoma) 11/06/2019   right breast(EXC)   SCC (squamous cell carcinoma) 11/06/2019   right lower leg anterior   SCCA (squamous cell carcinoma) of skin 03/26/2014   Right Collarbone(in situ) (curet and 5FU)   SCCA (squamous cell carcinoma) of skin 05/08/2018   Right Ear Rim(in situ) (curet and 5FU)   Squamous cell carcinoma in situ (SCCIS) 03/26/2014   scc in situ Right collar bone   Squamous cell carcinoma in situ (SCCIS) 05/08/2018   Right ear rim   Testicular cancer    Past Surgical History:  Procedure Laterality Date   COLONOSCOPY     KNEE SURGERY  orchiectomy left  2006   POLYPECTOMY     ROTATOR CUFF REPAIR     Family History  Problem Relation Age of Onset   Heart disease Father    Seizures Father    Colon cancer Neg Hx    Colon polyps Neg Hx    Esophageal cancer Neg Hx    Rectal cancer Neg Hx    Stomach cancer Neg Hx    Social History   Socioeconomic History   Marital status: Married    Spouse name: Brandonlee Navis   Number of children: Not on file   Years of education: Not on file   Highest education level: Not on file  Occupational History   Not on file  Tobacco Use    Smoking status: Never   Smokeless tobacco: Never  Vaping Use   Vaping Use: Never used  Substance and Sexual Activity   Alcohol use: No   Drug use: No   Sexual activity: Yes  Other Topics Concern   Not on file  Social History Narrative   Not on file   Social Determinants of Health   Financial Resource Strain: Low Risk  (10/10/2022)   Overall Financial Resource Strain (CARDIA)    Difficulty of Paying Living Expenses: Not hard at all  Food Insecurity: No Food Insecurity (10/10/2022)   Hunger Vital Sign    Worried About Running Out of Food in the Last Year: Never true    Ran Out of Food in the Last Year: Never true  Transportation Needs: No Transportation Needs (10/10/2022)   PRAPARE - Administrator, Civil Service (Medical): No    Lack of Transportation (Non-Medical): No  Physical Activity: Sufficiently Active (10/10/2022)   Exercise Vital Sign    Days of Exercise per Week: 7 days    Minutes of Exercise per Session: 60 min  Stress: No Stress Concern Present (10/10/2022)   [employer] of Occupational Health - Occupational Stress Questionnaire    Feeling of Stress : Not at all  Social Connections: Socially Integrated (10/10/2022)   Social Connection and Isolation Panel [NHANES]    Frequency of Communication with Friends and Family: More than three times a week    Frequency of Social Gatherings with Friends and Family: More than three times a week    Attends Religious Services: More than 4 times per year    Active Member of [employer] or Organizations: Yes    Attends Engineer, structural: More than 4 times per year    Marital Status: Married    Tobacco Counseling Counseling given: Not Answered   Clinical Intake:  Pre-visit preparation completed: No  Pain : No/denies pain  Diabetic?  No  Interpreter Needed?: No  Information entered by :: Theresa Mulligan LPN   Activities of Daily Living    10/10/2022   10:50 AM  In your present state of health, do you have  any difficulty performing the following activities:  Hearing? 0  Vision? 0  Difficulty concentrating or making decisions? 0  Walking or climbing stairs? 0  Dressing or bathing? 0  Doing errands, shopping? 0  Preparing Food and eating ? N  Using the Toilet? N  In the past six months, have you accidently leaked urine? N  Do you have problems with loss of bowel control? N  Managing your Medications? N  Managing your Finances? N  Housekeeping or managing your Housekeeping? N    Patient Care Team: Kristian Covey, MD as PCP - General (  Family Medicine) Janalyn Harderafeen, Stuart, MD (Inactive) as Consulting Physician (Dermatology)  Indicate any recent Medical Services you may have received from other than [facility] providers in the past year (date may be approximate).     Assessment:   This is a routine wellness examination for Iantha FallenKenneth.  Hearing/Vision screen Hearing Screening - Comments:: Denies hearing difficulties   Vision Screening - Comments:: Wears rx glasses - up to date with routine eye exams with  [facility]   Dietary issues and exercise activities discussed: Current Exercise Habits: Home exercise routine, Type of exercise: walking, Time (Minutes): 60, Frequency (Times/Week): 7, Weekly Exercise (Minutes/Week): 420, Intensity: Moderate, Exercise limited by: None identified   Goals Addressed   None    Depression Screen    10/10/2022   10:49 AM 03/28/2022    1:45 PM 10/05/2021    3:17 PM 06/21/2021   10:19 AM 02/25/2019    2:14 PM 02/14/2018    7:09 AM  PHQ 2/9 Scores  PHQ - 2 Score 0 0 0 0 0 0  PHQ- 9 Score  1   0     Fall Risk    10/10/2022   10:51 AM 03/28/2022    1:45 PM 10/05/2021    3:16 PM 02/14/2018    7:09 AM  Fall Risk   Falls in the past year? 0 0 0 No  Number falls in past yr: 0 0 0   Injury with Fall? 0 0 0   Risk for fall due to : No Fall Risks No Fall Risks No Fall Risks   Follow up Falls prevention discussed Falls evaluation completed Falls evaluation  completed;Education provided;Falls prevention discussed     FALL RISK PREVENTION PERTAINING TO THE HOME:  Any stairs in or around the home? Yes  If so, are there any without handrails? No  Home free of loose throw rugs in walkways, pet beds, electrical cords, etc? Yes  Adequate lighting in your home to reduce risk of falls? Yes   ASSISTIVE DEVICES UTILIZED TO PREVENT FALLS:  Life alert? No  Use of a cane, walker or w/c? No  Grab bars in the bathroom? No  Shower chair or bench in shower? No  Elevated toilet seat or a handicapped toilet? No   TIMED UP AND GO:  Was the test performed? No . Audio Visit   Cognitive Function:        10/10/2022   10:51 AM 10/05/2021    3:18 PM  6CIT Screen  What Year? 0 points 0 points  What month? 0 points 0 points  What time? 0 points 0 points  Count back from 20 0 points 0 points  Months in reverse 0 points 0 points  Repeat phrase 0 points 2 points  Total Score 0 points 2 points    Immunizations Immunization History  Administered Date(s) Administered   Influenza,inj,Quad PF,6+ Mos 03/16/2020   PNEUMOCOCCAL CONJUGATE-20 03/22/2021   Td 07/04/2006   Tdap 11/17/2011   Zoster, Live 02/11/2015    TDAP status: Due, Education has been provided regarding the importance of this vaccine. Advised may receive this vaccine at local pharmacy or Health Dept. Aware to provide a copy of the vaccination record if obtained from local pharmacy or Health Dept. Verbalized acceptance and understanding.  Flu Vaccine status: Up to date  Pneumococcal vaccine status: Up to date  Covid-19 vaccine status: Declined, Education has been provided regarding the importance of this vaccine but patient still declined. Advised may receive this vaccine at local pharmacy or  Health Dept.or vaccine clinic. Aware to provide a copy of the vaccination record if obtained from local pharmacy or Health Dept. Verbalized acceptance and understanding.  Qualifies for Shingles Vaccine?  Yes   Zostavax completed No   Shingrix Completed?: No.    Education has been provided regarding the importance of this vaccine. Patient has been advised to call insurance company to determine out of pocket expense if they have not yet received this vaccine. Advised may also receive vaccine at local pharmacy or Health Dept. Verbalized acceptance and understanding.  Screening Tests Health Maintenance  Topic Date Due   DTaP/Tdap/Td (3 - Td or Tdap) 11/16/2021   COVID-19 Vaccine (1) 10/26/2022 (Originally 09/05/1960)   Zoster Vaccines- Shingrix (1 of 2) 01/09/2023 (Originally 09/06/1974)   INFLUENZA VACCINE  02/02/2023   Medicare Annual Wellness (AWV)  10/10/2023   COLONOSCOPY (Pts 45-33yrs Insurance coverage will need to be confirmed)  04/21/2027   Pneumonia Vaccine 8+ Years old  Completed   Hepatitis C Screening  Completed   HPV VACCINES  Aged Out    Health Maintenance  Health Maintenance Due  Topic Date Due   DTaP/Tdap/Td (3 - Td or Tdap) 11/16/2021    Colorectal cancer screening: Type of screening: Colonoscopy. Completed 04/20/17. Repeat every 10 years  Lung Cancer Screening: (Low Dose CT Chest recommended if Age 39-80 years, 30 pack-year currently smoking OR have quit w/in 15years.) does not qualify.     Additional Screening:  Hepatitis C Screening: does qualify; Completed 02/13/17  Vision Screening: Recommended annual ophthalmology exams for early detection of glaucoma and other disorders of the eye. Is the patient up to date with their annual eye exam?  Yes  Who is the provider or what is the name of the office in which the patient attends annual eye exams? [facility] If pt is not established with a provider, would they like to be referred to a provider to establish care? No .   Dental Screening: Recommended annual dental exams for proper oral hygiene  Community Resource Referral / Chronic Care Management:  CRR required this visit?  No   CCM required this visit?  No       Plan:     I have personally reviewed and noted the following in the patient's chart:   Medical and social history Use of alcohol, tobacco or illicit drugs  Current medications and supplements including opioid prescriptions. Patient is not currently taking opioid prescriptions. Functional ability and status Nutritional status Physical activity Advanced directives List of other physicians Hospitalizations, surgeries, and ER visits in previous 12 months Vitals Screenings to include cognitive, depression, and falls Referrals and appointments  In addition, I have reviewed and discussed with patient certain preventive protocols, quality metrics, and best practice recommendations. A written personalized care plan for preventive services as well as general preventive health recommendations were provided to patient.     [name], LPN   10/07/386   Nurse Notes: None

## 2022-10-11 DIAGNOSIS — T63451D Toxic effect of venom of hornets, accidental (unintentional), subsequent encounter: Secondary | ICD-10-CM | POA: Diagnosis not present

## 2022-10-11 DIAGNOSIS — T63461D Toxic effect of venom of wasps, accidental (unintentional), subsequent encounter: Secondary | ICD-10-CM | POA: Diagnosis not present

## 2022-11-09 DIAGNOSIS — M9903 Segmental and somatic dysfunction of lumbar region: Secondary | ICD-10-CM | POA: Diagnosis not present

## 2022-11-09 DIAGNOSIS — M9902 Segmental and somatic dysfunction of thoracic region: Secondary | ICD-10-CM | POA: Diagnosis not present

## 2022-11-09 DIAGNOSIS — M9905 Segmental and somatic dysfunction of pelvic region: Secondary | ICD-10-CM | POA: Diagnosis not present

## 2022-11-09 DIAGNOSIS — M9904 Segmental and somatic dysfunction of sacral region: Secondary | ICD-10-CM | POA: Diagnosis not present

## 2022-11-11 DIAGNOSIS — H353121 Nonexudative age-related macular degeneration, left eye, early dry stage: Secondary | ICD-10-CM | POA: Diagnosis not present

## 2022-11-24 DIAGNOSIS — R972 Elevated prostate specific antigen [PSA]: Secondary | ICD-10-CM | POA: Diagnosis not present

## 2022-12-01 ENCOUNTER — Other Ambulatory Visit: Payer: Self-pay | Admitting: Family Medicine

## 2022-12-01 MED ORDER — NONFORMULARY OR COMPOUNDED ITEM: 5 refills | Status: AC

## 2022-12-01 NOTE — Telephone Encounter (Signed)
Prescription Request  12/01/2022  LOV: 03/28/2022  What is the name of the medication or equipment? Testosterone 12.5 mg  Have you contacted your pharmacy to request a refill? Yes   Pt states Pharmacy is saying refill has been denied.  Pt would like to know why?  Pt states he only has 2-3 days worth left, and he has been taking this Rx for years.  Pt is aware MD is OOO until 12/05/22, and is asking if another provider could assist with this refill request.  Please advise.   Which pharmacy would you like this sent to?   [facility] - [place], [place] - [address] [place] [address] Phone: [phone] Fax: [phone]    Patient notified that their request is being sent to the clinical staff for review and that they should receive a response within 2 business days.   Please advise at Mobile [phone] (mobile)

## 2022-12-01 NOTE — Telephone Encounter (Signed)
Prescription faxed and patient aware 

## 2022-12-07 DIAGNOSIS — L218 Other seborrheic dermatitis: Secondary | ICD-10-CM | POA: Diagnosis not present

## 2022-12-07 DIAGNOSIS — S80812A Abrasion, left lower leg, initial encounter: Secondary | ICD-10-CM | POA: Diagnosis not present

## 2022-12-07 DIAGNOSIS — L570 Actinic keratosis: Secondary | ICD-10-CM | POA: Diagnosis not present

## 2022-12-07 DIAGNOSIS — L821 Other seborrheic keratosis: Secondary | ICD-10-CM | POA: Diagnosis not present

## 2022-12-07 DIAGNOSIS — S80811A Abrasion, right lower leg, initial encounter: Secondary | ICD-10-CM | POA: Diagnosis not present

## 2022-12-12 DIAGNOSIS — T63461D Toxic effect of venom of wasps, accidental (unintentional), subsequent encounter: Secondary | ICD-10-CM | POA: Diagnosis not present

## 2022-12-12 DIAGNOSIS — T63451D Toxic effect of venom of hornets, accidental (unintentional), subsequent encounter: Secondary | ICD-10-CM | POA: Diagnosis not present

## 2022-12-29 DIAGNOSIS — L218 Other seborrheic dermatitis: Secondary | ICD-10-CM | POA: Diagnosis not present

## 2022-12-29 DIAGNOSIS — L728 Other follicular cysts of the skin and subcutaneous tissue: Secondary | ICD-10-CM | POA: Diagnosis not present

## 2023-01-06 DIAGNOSIS — T63461D Toxic effect of venom of wasps, accidental (unintentional), subsequent encounter: Secondary | ICD-10-CM | POA: Diagnosis not present

## 2023-01-06 DIAGNOSIS — T63451D Toxic effect of venom of hornets, accidental (unintentional), subsequent encounter: Secondary | ICD-10-CM | POA: Diagnosis not present

## 2023-02-01 DIAGNOSIS — Z1330 Encounter for screening examination for mental health and behavioral disorders, unspecified: Secondary | ICD-10-CM | POA: Diagnosis not present

## 2023-02-01 DIAGNOSIS — R0683 Snoring: Secondary | ICD-10-CM | POA: Diagnosis not present

## 2023-02-01 DIAGNOSIS — G4710 Hypersomnia, unspecified: Secondary | ICD-10-CM | POA: Diagnosis not present

## 2023-02-01 DIAGNOSIS — R5383 Other fatigue: Secondary | ICD-10-CM | POA: Diagnosis not present

## 2023-02-15 DIAGNOSIS — Z1329 Encounter for screening for other suspected endocrine disorder: Secondary | ICD-10-CM | POA: Diagnosis not present

## 2023-02-15 DIAGNOSIS — E538 Deficiency of other specified B group vitamins: Secondary | ICD-10-CM | POA: Diagnosis not present

## 2023-02-15 DIAGNOSIS — R7989 Other specified abnormal findings of blood chemistry: Secondary | ICD-10-CM | POA: Diagnosis not present

## 2023-02-15 DIAGNOSIS — R194 Change in bowel habit: Secondary | ICD-10-CM | POA: Diagnosis not present

## 2023-02-20 DIAGNOSIS — T63451D Toxic effect of venom of hornets, accidental (unintentional), subsequent encounter: Secondary | ICD-10-CM | POA: Diagnosis not present

## 2023-02-20 DIAGNOSIS — T63461D Toxic effect of venom of wasps, accidental (unintentional), subsequent encounter: Secondary | ICD-10-CM | POA: Diagnosis not present

## 2023-02-24 DIAGNOSIS — R194 Change in bowel habit: Secondary | ICD-10-CM | POA: Diagnosis not present

## 2023-02-27 DIAGNOSIS — E785 Hyperlipidemia, unspecified: Secondary | ICD-10-CM | POA: Diagnosis not present

## 2023-02-27 DIAGNOSIS — Z125 Encounter for screening for malignant neoplasm of prostate: Secondary | ICD-10-CM | POA: Diagnosis not present

## 2023-02-27 DIAGNOSIS — Z1329 Encounter for screening for other suspected endocrine disorder: Secondary | ICD-10-CM | POA: Diagnosis not present

## 2023-02-27 DIAGNOSIS — E538 Deficiency of other specified B group vitamins: Secondary | ICD-10-CM | POA: Diagnosis not present

## 2023-02-27 DIAGNOSIS — E559 Vitamin D deficiency, unspecified: Secondary | ICD-10-CM | POA: Diagnosis not present

## 2023-02-27 DIAGNOSIS — R7989 Other specified abnormal findings of blood chemistry: Secondary | ICD-10-CM | POA: Diagnosis not present

## 2023-02-27 DIAGNOSIS — Z131 Encounter for screening for diabetes mellitus: Secondary | ICD-10-CM | POA: Diagnosis not present

## 2023-04-05 DIAGNOSIS — Z713 Dietary counseling and surveillance: Secondary | ICD-10-CM | POA: Diagnosis not present

## 2023-04-05 DIAGNOSIS — R194 Change in bowel habit: Secondary | ICD-10-CM | POA: Diagnosis not present

## 2023-04-17 DIAGNOSIS — T63461D Toxic effect of venom of wasps, accidental (unintentional), subsequent encounter: Secondary | ICD-10-CM | POA: Diagnosis not present

## 2023-04-17 DIAGNOSIS — T782XXD Anaphylactic shock, unspecified, subsequent encounter: Secondary | ICD-10-CM | POA: Diagnosis not present

## 2023-04-25 DIAGNOSIS — R0683 Snoring: Secondary | ICD-10-CM | POA: Diagnosis not present

## 2023-04-25 DIAGNOSIS — R5383 Other fatigue: Secondary | ICD-10-CM | POA: Diagnosis not present

## 2023-04-25 DIAGNOSIS — G4710 Hypersomnia, unspecified: Secondary | ICD-10-CM | POA: Diagnosis not present

## 2023-05-12 DIAGNOSIS — M19012 Primary osteoarthritis, left shoulder: Secondary | ICD-10-CM | POA: Diagnosis not present

## 2023-05-17 DIAGNOSIS — K219 Gastro-esophageal reflux disease without esophagitis: Secondary | ICD-10-CM | POA: Diagnosis not present

## 2023-05-17 DIAGNOSIS — K30 Functional dyspepsia: Secondary | ICD-10-CM | POA: Diagnosis not present

## 2023-05-24 DIAGNOSIS — G4733 Obstructive sleep apnea (adult) (pediatric): Secondary | ICD-10-CM | POA: Diagnosis not present

## 2023-06-06 DIAGNOSIS — R972 Elevated prostate specific antigen [PSA]: Secondary | ICD-10-CM | POA: Diagnosis not present

## 2023-06-06 DIAGNOSIS — Z8547 Personal history of malignant neoplasm of testis: Secondary | ICD-10-CM | POA: Diagnosis not present

## 2023-06-06 DIAGNOSIS — E291 Testicular hypofunction: Secondary | ICD-10-CM | POA: Diagnosis not present

## 2023-06-07 DIAGNOSIS — R7989 Other specified abnormal findings of blood chemistry: Secondary | ICD-10-CM | POA: Diagnosis not present

## 2023-06-07 DIAGNOSIS — K219 Gastro-esophageal reflux disease without esophagitis: Secondary | ICD-10-CM | POA: Diagnosis not present

## 2023-06-07 DIAGNOSIS — R194 Change in bowel habit: Secondary | ICD-10-CM | POA: Diagnosis not present

## 2023-06-14 DIAGNOSIS — R1013 Epigastric pain: Secondary | ICD-10-CM | POA: Diagnosis not present

## 2023-06-22 DIAGNOSIS — R142 Eructation: Secondary | ICD-10-CM | POA: Diagnosis not present

## 2023-06-22 DIAGNOSIS — R109 Unspecified abdominal pain: Secondary | ICD-10-CM | POA: Diagnosis not present

## 2023-06-22 DIAGNOSIS — R141 Gas pain: Secondary | ICD-10-CM | POA: Diagnosis not present

## 2023-06-27 DIAGNOSIS — C259 Malignant neoplasm of pancreas, unspecified: Secondary | ICD-10-CM | POA: Diagnosis not present

## 2023-07-03 DIAGNOSIS — K8689 Other specified diseases of pancreas: Secondary | ICD-10-CM | POA: Diagnosis not present

## 2023-07-03 DIAGNOSIS — E039 Hypothyroidism, unspecified: Secondary | ICD-10-CM | POA: Diagnosis not present

## 2023-07-03 DIAGNOSIS — K7689 Other specified diseases of liver: Secondary | ICD-10-CM | POA: Diagnosis not present

## 2024-02-02 DEATH — deceased
